# Patient Record
Sex: Female | Born: 1995 | Race: Black or African American | Hispanic: No | Marital: Single | Smoking: Never smoker
Health system: Southern US, Community
[De-identification: ages and names within clinical notes are randomized; demographics above are authoritative.]

## PROBLEM LIST (undated history)

## (undated) ENCOUNTER — Inpatient Hospital Stay (HOSPITAL_COMMUNITY): Payer: Self-pay

## (undated) DIAGNOSIS — D649 Anemia, unspecified: Secondary | ICD-10-CM

## (undated) DIAGNOSIS — M940 Chondrocostal junction syndrome [Tietze]: Secondary | ICD-10-CM

## (undated) HISTORY — PX: NO PAST SURGERIES: SHX2092

---
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
## 2018-01-22 ENCOUNTER — Encounter (HOSPITAL_COMMUNITY): Payer: Self-pay | Admitting: *Deleted

## 2018-01-22 ENCOUNTER — Other Ambulatory Visit: Payer: Self-pay

## 2018-01-22 ENCOUNTER — Ambulatory Visit (HOSPITAL_COMMUNITY)
Admission: EM | Admit: 2018-01-22 | Discharge: 2018-01-22 | Disposition: A | Discharge status: Home / Self Care | Payer: BLUE CROSS/BLUE SHIELD | Attending: Internal Medicine | Admitting: Internal Medicine

## 2018-01-22 DIAGNOSIS — H5711 Ocular pain, right eye: Secondary | ICD-10-CM

## 2018-01-22 MED ORDER — IBUPROFEN 800 MG PO TABS: 800.0000 mg | ORAL_TABLET | Freq: Three times a day (TID) | ORAL | 0 refills | Status: DC

## 2018-01-22 MED ORDER — TETRACAINE HCL 0.5 % OP SOLN
OPHTHALMIC | Status: AC
  Filled 2018-01-22: qty 4

## 2018-01-22 MED ORDER — FLUORESCEIN SODIUM 1 MG OP STRP
ORAL_STRIP | OPHTHALMIC | Status: AC
  Filled 2018-01-22: qty 1

## 2018-01-22 NOTE — ED Provider Notes (Signed)
[facility]    CSN: [id number] Arrival date & time: 01/22/18  1120     History   Chief Complaint Chief Complaint  Patient presents with  . Eye Problem    right eye swelling    HPI Cassandra Luna is a 22 y.o. female.   Cassandra Luna presents with complaints of right eye pain swelling and tearing which started 7/24. She states the swelling resolves by the end of the day, and returns and is worse in the morning when she wakes. No fevers. No injury. Denies any previous similar. No foreign body sensation and no known foreign body exposure potential. States vision feels normal, only difficulty is with swelling. Wears glasses, does not wear contacts. Follows with an eye doctor. Eye tenderness. No itching. Light sensitivity. Has not tried any treatments for her symptoms. Without contributing medical history.     ROS per HPI.      History reviewed. No pertinent past medical history.  There are no active problems to display for this patient.   History reviewed. No pertinent surgical history.  OB History   None      Home Medications    Prior to Admission medications   Medication Sig Start Date End Date Taking? Authorizing Provider  ibuprofen (ADVIL,MOTRIN) 800 MG tablet Take 1 tablet (800 mg total) by mouth 3 (three) times daily. 01/22/18   Georgetta HaberBurky, Odai Wimmer B, NP    Family History History reviewed. No pertinent family history.  Social History Social History   Tobacco Use  . Smoking status: Never Smoker  . Smokeless tobacco: Never Used  Substance Use Topics  . Alcohol use: No  . Drug use: Not on file     Allergies   Amoxicillin; Citrus; Peanut-containing drug products; Penicillins; Shellfish allergy; and Tomato   Review of Systems Review of Systems   Physical Exam Triage Vital Signs ED Triage Vitals  Enc Vitals Group     BP 01/22/18 1148 (!) 155/95     Pulse Rate 01/22/18 1148 76     Resp 01/22/18 1148 16     Temp 01/22/18 1148 98.1 F (36.7  C)     Temp Source 01/22/18 1148 Oral     SpO2 01/22/18 1148 100 %     Weight 01/22/18 1152 220 lb (99.8 kg)     Height 01/22/18 1152 5\' 1"  (1.549 m)     Head Circumference --      Peak Flow --      Pain Score 01/22/18 1151 0     Pain Loc --      Pain Edu? --      Excl. in GC? --    No data found.  Updated Vital Signs BP (!) 155/95 (BP Location: Left Arm)   Pulse 76   Temp 98.1 F (36.7 C) (Oral)   Resp 16   Ht 5\' 1"  (1.549 m)   Wt 220 lb (99.8 kg)   LMP  (LMP Unknown) Comment: Pt states LMP middle of May  SpO2 100%   BMI 41.57 kg/m   Visual Acuity Right Eye Distance: 20/100 Left Eye Distance: 20/25 Bilateral Distance: 20/30  Right Eye Near:   Left Eye Near:    Bilateral Near:  unable to open right eye at this time d/t swelling  Physical Exam  Constitutional: She is oriented to person, place, and time. She appears well-developed and well-nourished. No distress.  Eyes: Pupils are equal, round, and reactive to light. EOM are normal. Right eye exhibits discharge. Left  eye exhibits discharge. Right conjunctiva is injected.  Slit lamp exam:      The right eye shows no corneal abrasion, no foreign body, no hyphema and no fluorescein uptake.  Mild edema to upper and lower lid to right eye, no redness; mildly generalized tender; redness to right eye, no visible white; excess clear tearing, no purulent discharge; no pain s/p fluorescein and opens lids without difficulty at that time   Cardiovascular: Normal rate, regular rhythm and normal heart sounds.  Pulmonary/Chest: Effort normal and breath sounds normal.  Neurological: She is alert and oriented to person, place, and time.  Skin: Skin is warm and dry.     UC Treatments / Results  Labs (all labs ordered are listed, but only abnormal results are displayed) Labs Reviewed - No data to display  EKG None  Radiology No results found.  Procedures Procedures (including critical care time)  Medications Ordered in  UC Medications - No data to display  Initial Impression / Assessment and Plan / UC Course  I have reviewed the triage vital signs and the nursing notes.  Pertinent labs & imaging results that were available during my care of the patient were reviewed by me and considered in my medical decision making (see chart for details).     Redness, light senstivity, mild swelling and tenderness, no fluorescein uptake. Noted altered acuity, unknown baseline. Spoke to Dr. 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 Treat with oral nsaids, follow up for recheck at her office on Monday 7/29. Return precautions provided to patient and Dr. Blenda Bridegroom number provider per Dr's permission/recommendation. Patient verbalized understanding and agreeable to plan.   Final Clinical Impressions(s) / UC Diagnoses   Final diagnoses:  Acute pain in right eye     Discharge Instructions     Please take ibuprofen three times a day, take with food.  Cold compresses may be helpful with symptoms.  Please go on Monday to see Dr. Tawny Asal for follow up as I have spoken to her about you today.  If you have any worsening of symptoms, increased pain, vision change, fevers or worsening concerns you may go to the ER or you may call Dr. Tawny Asal- [phone]    ED Prescriptions    Medication Sig Dispense Auth. Provider   ibuprofen (ADVIL,MOTRIN) 800 MG tablet Take 1 tablet (800 mg total) by mouth 3 (three) times daily. 21 tablet Georgetta Haber, NP     Controlled Substance Prescriptions [place] Controlled Substance Registry consulted? Not Applicable   Georgetta Haber, NP 01/22/18 1252

## 2018-01-22 NOTE — ED Triage Notes (Signed)
Pt states right eye swelling started on Wednesday. Pt states that each morning her eye is "swollen shut" and that swelling decreased during the day. Pt denies any recent injury to eye.

## 2018-01-22 NOTE — Discharge Instructions (Signed)
Please take ibuprofen three times a day, take with food.  Cold compresses may be helpful with symptoms.  Please go on Monday to see Dr. Tawny AsalBottorff for follow up as I have spoken to her about you today.  If you have any worsening of symptoms, increased pain, vision change, fevers or worsening concerns you may go to the ER or you may call Dr. Tawny AsalBottorff- [phone]

## 2018-07-15 ENCOUNTER — Other Ambulatory Visit: Payer: Self-pay

## 2018-07-15 ENCOUNTER — Inpatient Hospital Stay (HOSPITAL_COMMUNITY)
Admission: AD | Admit: 2018-07-15 | Discharge: 2018-07-15 | Disposition: A | Discharge status: Home / Self Care | Payer: BLUE CROSS/BLUE SHIELD | Attending: Obstetrics & Gynecology | Admitting: Obstetrics & Gynecology

## 2018-07-15 ENCOUNTER — Encounter (HOSPITAL_COMMUNITY): Payer: Self-pay | Admitting: *Deleted

## 2018-07-15 DIAGNOSIS — Z3A14 14 weeks gestation of pregnancy: Secondary | ICD-10-CM

## 2018-07-15 DIAGNOSIS — O210 Mild hyperemesis gravidarum: Secondary | ICD-10-CM | POA: Diagnosis not present

## 2018-07-15 DIAGNOSIS — O26892 Other specified pregnancy related conditions, second trimester: Secondary | ICD-10-CM | POA: Diagnosis present

## 2018-07-15 DIAGNOSIS — O219 Vomiting of pregnancy, unspecified: Secondary | ICD-10-CM | POA: Diagnosis not present

## 2018-07-15 DIAGNOSIS — Z880 Allergy status to penicillin: Secondary | ICD-10-CM | POA: Insufficient documentation

## 2018-07-15 HISTORY — DX: Chondrocostal junction syndrome (tietze): M94.0

## 2018-07-15 LAB — URINALYSIS, ROUTINE W REFLEX MICROSCOPIC
BILIRUBIN URINE: NEGATIVE
GLUCOSE, UA: NEGATIVE mg/dL
HGB URINE DIPSTICK: NEGATIVE
Ketones, ur: NEGATIVE mg/dL
LEUKOCYTES UA: NEGATIVE
Nitrite: NEGATIVE
PH: 6 (ref 5.0–8.0)
Protein, ur: NEGATIVE mg/dL
Specific Gravity, Urine: 1.024 (ref 1.005–1.030)

## 2018-07-15 LAB — COMPREHENSIVE METABOLIC PANEL
ALT: 14 U/L (ref 0–44)
AST: 13 U/L — ABNORMAL LOW (ref 15–41)
Albumin: 3.1 g/dL — ABNORMAL LOW (ref 3.5–5.0)
Alkaline Phosphatase: 75 U/L (ref 38–126)
Anion gap: 8 (ref 5–15)
BUN: 5 mg/dL — AB (ref 6–20)
CO2: 20 mmol/L — ABNORMAL LOW (ref 22–32)
Calcium: 8.5 mg/dL — ABNORMAL LOW (ref 8.9–10.3)
Chloride: 105 mmol/L (ref 98–111)
Creatinine, Ser: 0.76 mg/dL (ref 0.44–1.00)
Glucose, Bld: 78 mg/dL (ref 70–99)
Potassium: 3.1 mmol/L — ABNORMAL LOW (ref 3.5–5.1)
Sodium: 133 mmol/L — ABNORMAL LOW (ref 135–145)
Total Bilirubin: 0.5 mg/dL (ref 0.3–1.2)
Total Protein: 7.1 g/dL (ref 6.5–8.1)

## 2018-07-15 LAB — CBC
HEMATOCRIT: 33.7 % — AB (ref 36.0–46.0)
Hemoglobin: 10.8 g/dL — ABNORMAL LOW (ref 12.0–15.0)
MCH: 24.9 pg — AB (ref 26.0–34.0)
MCHC: 32 g/dL (ref 30.0–36.0)
MCV: 77.8 fL — AB (ref 80.0–100.0)
NRBC: 0 % (ref 0.0–0.2)
Platelets: 280 10*3/uL (ref 150–400)
RBC: 4.33 MIL/uL (ref 3.87–5.11)
RDW: 14.8 % (ref 11.5–15.5)
WBC: 8.2 10*3/uL (ref 4.0–10.5)

## 2018-07-15 LAB — POCT PREGNANCY, URINE: Preg Test, Ur: POSITIVE — AB

## 2018-07-15 MED ORDER — ONDANSETRON 8 MG PO TBDP
8.0000 mg | ORAL_TABLET | Freq: Once | ORAL | Status: AC
  Administered 2018-07-15: 8 mg via ORAL
  Filled 2018-07-15: qty 1

## 2018-07-15 MED ORDER — METOCLOPRAMIDE HCL 10 MG PO TABS: 10.0000 mg | ORAL_TABLET | Freq: Three times a day (TID) | ORAL | 0 refills | Status: DC | PRN

## 2018-07-15 MED ORDER — ALUM & MAG HYDROXIDE-SIMETH 200-200-20 MG/5ML PO SUSP
30.0000 mL | Freq: Once | ORAL | Status: AC
  Administered 2018-07-15: 30 mL via ORAL
  Filled 2018-07-15: qty 30

## 2018-07-15 MED ORDER — LIDOCAINE VISCOUS HCL 2 % MT SOLN
15.0000 mL | Freq: Once | OROMUCOSAL | Status: AC
  Administered 2018-07-15: 15 mL via ORAL
  Filled 2018-07-15: qty 15

## 2018-07-15 MED ORDER — FAMOTIDINE 20 MG PO TABS: 20.0000 mg | ORAL_TABLET | Freq: Two times a day (BID) | ORAL | 0 refills | Status: DC

## 2018-07-15 NOTE — MAU Provider Note (Signed)
Chief Complaint: Nausea; Chest Pain; Dizziness; and Headache   First Provider Initiated Contact with Patient 07/15/18 1422     SUBJECTIVE HPI: Cassandra Luna is a 23 y.o. G1P0 at 4771w3d who presents to [facility] reporting nausea, dizziness, abdominal pain, and chest pain. Symptoms started after eating a [employer] hamburger last night. Reports substernal discomfort. No SOB. No hx of asthma. Generalized abdominal pain that is worse in upper area. Nauseated, but hasn't vomited. Feels like nausea is keeping her from eating since last night. Dizzy today, thinks that is dues to not eating or drinking.  Denies diarrhea, constipation, dysuria, or vaginal bleeding. Has not been seen for prenatal care.   Location: chest & abdomen Quality: aching and tightness Severity: 10/10 on pain scale Duration: 1 day Timing: constant Modifying factors: nothing makes better or worse Associated signs and symptoms: nausea  Past Medical History:  Diagnosis Date  . Costochondritis    OB History  Gravida Para Term Preterm AB Living  1            SAB TAB Ectopic Multiple Live Births               # Outcome Date GA Lbr Len/2nd Weight Sex Delivery Anes PTL Lv  1 Current            History reviewed. No pertinent surgical history. Social History   Socioeconomic History  . Marital status: Single    Spouse name: Not on file  . Number of children: Not on file  . Years of education: Not on file  . Highest education level: Not on file  Occupational History  . Not on file  Social Needs  . Financial resource strain: Not on file  . Food insecurity:    Worry: Not on file    Inability: Not on file  . Transportation needs:    Medical: Not on file    Non-medical: Not on file  Tobacco Use  . Smoking status: Never Smoker  . Smokeless tobacco: Never Used  Substance and Sexual Activity  . Alcohol use: No  . Drug use: Not Currently  . Sexual activity: Yes    Birth control/protection: None   Lifestyle  . Physical activity:    Days per week: Not on file    Minutes per session: Not on file  . Stress: Not on file  Relationships  . Social connections:    Talks on phone: Not on file    Gets together: Not on file    Attends religious service: Not on file    Active member of club or organization: Not on file    Attends meetings of clubs or organizations: Not on file    Relationship status: Not on file  . Intimate partner violence:    Fear of current or ex partner: Not on file    Emotionally abused: Not on file    Physically abused: Not on file    Forced sexual activity: Not on file  Other Topics Concern  . Not on file  Social History Narrative  . Not on file   History reviewed. No pertinent family history. No current facility-administered medications on file prior to encounter.    No current outpatient medications on file prior to encounter.   Allergies  Allergen Reactions  . Amoxicillin     "throat swelling"  . Citrus   . Peanut-Containing Drug Products   . Penicillins   . Shellfish Allergy   . Tomato     I have  reviewed patient's Past Medical Hx, Surgical Hx, Family Hx, Social Hx, medications and allergies.   Review of Systems  Constitutional: Negative.   Respiratory: Negative for shortness of breath.   Cardiovascular: Positive for chest pain. Negative for palpitations.  Gastrointestinal: Positive for abdominal pain and nausea. Negative for constipation, diarrhea and vomiting.  Genitourinary: Negative.   Neurological: Positive for dizziness and headaches.    OBJECTIVE Patient Vitals for the past 24 hrs:  BP Temp Temp src Pulse Resp SpO2 Weight  07/15/18 1736 119/64 - - - - - -  07/15/18 1311 125/71 98.6 F (37 C) Oral 63 16 100 % 97.2 kg  07/15/18 1309 - - - - - 100 % -   Constitutional: Well-developed, well-nourished female in no acute distress.  Cardiovascular: normal rate & rhythm, no murmur Respiratory: normal rate and effort. Lung sounds  clear throughout GI: Abd soft, non-tender, Pos BS x 4. No guarding or rebound tenderness MS: Extremities nontender, no edema, normal ROM Neurologic: Alert and oriented x 4.     LAB RESULTS Results for orders placed or performed during the hospital encounter of 07/15/18 (from the past 24 hour(s))  Urinalysis, Routine w reflex microscopic     Status: Abnormal   Collection Time: 07/15/18  1:22 PM  Result Value Ref Range   Color, Urine YELLOW YELLOW   APPearance HAZY (A) CLEAR   Specific Gravity, Urine 1.024 1.005 - 1.030   pH 6.0 5.0 - 8.0   Glucose, UA NEGATIVE NEGATIVE mg/dL   Hgb urine dipstick NEGATIVE NEGATIVE   Bilirubin Urine NEGATIVE NEGATIVE   Ketones, ur NEGATIVE NEGATIVE mg/dL   Protein, ur NEGATIVE NEGATIVE mg/dL   Nitrite NEGATIVE NEGATIVE   Leukocytes, UA NEGATIVE NEGATIVE  Pregnancy, urine POC     Status: Abnormal   Collection Time: 07/15/18  1:24 PM  Result Value Ref Range   Preg Test, Ur POSITIVE (A) NEGATIVE  CBC     Status: Abnormal   Collection Time: 07/15/18  3:36 PM  Result Value Ref Range   WBC 8.2 4.0 - 10.5 K/uL   RBC 4.33 3.87 - 5.11 MIL/uL   Hemoglobin 10.8 (L) 12.0 - 15.0 g/dL   HCT 78.233.7 (L) 95.636.0 - 21.346.0 %   MCV 77.8 (L) 80.0 - 100.0 fL   MCH 24.9 (L) 26.0 - 34.0 pg   MCHC 32.0 30.0 - 36.0 g/dL   RDW 08.614.8 57.811.5 - 46.915.5 %   Platelets 280 150 - 400 K/uL   nRBC 0.0 0.0 - 0.2 %  Comprehensive metabolic panel     Status: Abnormal   Collection Time: 07/15/18  3:36 PM  Result Value Ref Range   Sodium 133 (L) 135 - 145 mmol/L   Potassium 3.1 (L) 3.5 - 5.1 mmol/L   Chloride 105 98 - 111 mmol/L   CO2 20 (L) 22 - 32 mmol/L   Glucose, Bld 78 70 - 99 mg/dL   BUN 5 (L) 6 - 20 mg/dL   Creatinine, Ser 6.290.76 0.44 - 1.00 mg/dL   Calcium 8.5 (L) 8.9 - 10.3 mg/dL   Total Protein 7.1 6.5 - 8.1 g/dL   Albumin 3.1 (L) 3.5 - 5.0 g/dL   AST 13 (L) 15 - 41 U/L   ALT 14 0 - 44 U/L   Alkaline Phosphatase 75 38 - 126 U/L   Total Bilirubin 0.5 0.3 - 1.2 mg/dL   GFR calc  non Af Amer >60 >60 mL/min   GFR calc Af Amer >60 >60 mL/min  Anion gap 8 5 - 15    IMAGING No results found.  MAU COURSE Orders Placed This Encounter  Procedures  . Urinalysis, Routine w reflex microscopic  . CBC  . Comprehensive metabolic panel  . Pregnancy, urine POC  . ED EKG  . Discharge patient   Meds ordered this encounter  Medications  . ondansetron (ZOFRAN-ODT) disintegrating tablet 8 mg  . AND Linked Order Group   . alum & mag hydroxide-simeth (MAALOX/MYLANTA) 200-200-20 MG/5ML suspension 30 mL   . lidocaine (XYLOCAINE) 2 % viscous mouth solution 15 mL  . metoCLOPramide (REGLAN) 10 MG tablet    Sig: Take 1 tablet (10 mg total) by mouth every 8 (eight) hours as needed.    Dispense:  30 tablet    Refill:  0    Order Specific Question:   Supervising Provider    Answer:   Adam Phenix [[id number]]  . famotidine (PEPCID) 20 MG tablet    Sig: Take 1 tablet (20 mg total) by mouth 2 (two) times daily for 30 days.    Dispense:  60 tablet    Refill:  0    Order Specific Question:   Supervising Provider    Answer:   Adam Phenix [[id number]]    MDM FHT present by doppler VSS, NAD EKG, normal sinus rhythm Heart sounds normal & lung sounds clear throughout. SpO2 100% CBC & CMP ordered -- mild anemia  Zofran, Maalox & viscous lidocaine given Will send home w/rx for reglan & pepcid. Encouraged patient to start prenatal care.   ASSESSMENT 1. Nausea and vomiting during pregnancy prior to [redacted] weeks gestation   2. [redacted] weeks gestation of pregnancy     PLAN Discharge home in stable condition.   Allergies as of 07/15/2018      Reactions   Amoxicillin    "throat swelling"   Citrus    Peanut-containing Drug Products    Penicillins    Shellfish Allergy    Tomato       Medication List    STOP taking these medications   ibuprofen 800 MG tablet Commonly known as:  ADVIL,MOTRIN     TAKE these medications   famotidine 20 MG tablet Commonly known as:  PEPCID Take 1  tablet (20 mg total) by mouth 2 (two) times daily for 30 days.   metoCLOPramide 10 MG tablet Commonly known as:  REGLAN Take 1 tablet (10 mg total) by mouth every 8 (eight) hours as needed.        Judeth Horn, NP 07/15/2018  5:55 PM

## 2018-07-15 NOTE — Discharge Instructions (Signed)
[facility] ° ° °[facility]       Phone: [phone] ° °[facility] Phone: [phone] ° °[facility]  Phone: [phone] ° °[facility] [place] [facility]  Phone: [phone] ° °[facility]  Phone: [phone] ° °[facility]       Phone: [phone] ° °[facility]    Phone: [phone]  ° °[facility]    Phone: [phone] ° °[facility]    Phone: [phone] ° °[facility]    Phone: [phone]  ° °[facility]  Phone: [phone] ° °[facility]    Phone: [phone] ° °[facility]   Phone: [phone] ° °[facility]    Phone: [phone] ° ° ° ° ° ° ° °Second Trimester of Pregnancy °The second trimester is from week 14 through week 27 (months 4 through 6). The second trimester is often a time when you feel your best. Your body has adjusted to being pregnant, and you begin to feel better physically. Usually, morning sickness has lessened or quit completely, you may have more energy, and you may have an increase in appetite. The second trimester is also a time when the fetus is growing rapidly. At the end of the sixth month, the fetus is about 9 inches long and weighs about 1½ pounds. You will likely begin to feel the baby move (quickening) between 16 and 20 weeks of pregnancy. °Body changes during your second trimester °Your body continues to go through many changes during your second trimester. The changes vary from woman to woman. °· Your weight will continue to increase. You will notice your lower abdomen bulging out. °· You may begin to get stretch marks on your hips, abdomen, and breasts. °· You may develop headaches that  can be relieved by medicines. The medicines should be approved by your health care provider. °· You may urinate more often because the fetus is pressing on your bladder. °· You may develop or continue to have heartburn as a result of your pregnancy. °· You may develop constipation because certain hormones are causing the muscles that push waste through your intestines to slow down. °· You may develop hemorrhoids or swollen, bulging veins (varicose veins). °· You may have back pain. This is caused by: °? Weight gain. °? Pregnancy hormones that are relaxing the joints in your pelvis. °? A shift in weight and the muscles that support your balance. °· Your breasts will continue to grow and they will continue to become tender. °· Your gums may bleed and may be sensitive to brushing and flossing. °· Dark spots or blotches (chloasma, mask of pregnancy) may develop on your face. This will likely fade after the baby is born. °· A dark line from your belly button to the pubic area (linea nigra) may appear. This will likely fade after the baby is born. °· You may have changes in your hair. These can include thickening of your hair, rapid growth, and changes in texture. Some women also have hair loss during or after pregnancy, or hair that feels dry or thin. Your hair will most likely return to normal after your baby is born. °What to expect at prenatal visits °During a routine prenatal visit: °· You will be weighed to make sure you and the fetus are   growing normally. °· Your blood pressure will be taken. °· Your abdomen will be measured to track your baby's growth. °· The fetal heartbeat will be listened to. °· Any test results from the previous visit will be discussed. °Your health care provider may ask you: °· How you are feeling. °· If you are feeling the baby move. °· If you have had any abnormal symptoms, such as leaking fluid, bleeding, severe headaches, or abdominal cramping. °· If you are using any tobacco products,  including cigarettes, chewing tobacco, and electronic cigarettes. °· If you have any questions. °Other tests that may be performed during your second trimester include: °· Blood tests that check for: °? Low iron levels (anemia). °? High blood sugar that affects pregnant women (gestational diabetes) between 24 and 28 weeks. °? Rh antibodies. This is to check for a protein on red blood cells (Rh factor). °· Urine tests to check for infections, diabetes, or protein in the urine. °· An ultrasound to confirm the proper growth and development of the baby. °· An amniocentesis to check for possible genetic problems. °· Fetal screens for spina bifida and Down syndrome. °· HIV (human immunodeficiency virus) testing. Routine prenatal testing includes screening for HIV, unless you choose not to have this test. °Follow these instructions at home: °Medicines °· Follow your health care provider's instructions regarding medicine use. Specific medicines may be either safe or unsafe to take during pregnancy. °· Take a prenatal vitamin that contains at least 600 micrograms (mcg) of folic acid. °· If you develop constipation, try taking a stool softener if your health care provider approves. °Eating and drinking ° °· Eat a balanced diet that includes fresh fruits and vegetables, whole grains, good sources of protein such as meat, eggs, or tofu, and low-fat dairy. Your health care provider will help you determine the amount of weight gain that is right for you. °· Avoid raw meat and uncooked cheese. These carry germs that can cause birth defects in the baby. °· If you have low calcium intake from food, talk to your health care provider about whether you should take a daily calcium supplement. °· Limit foods that are high in fat and processed sugars, such as fried and sweet foods. °· To prevent constipation: °? Drink enough fluid to keep your urine clear or pale yellow. °? Eat foods that are high in fiber, such as fresh fruits and  vegetables, whole grains, and beans. °Activity °· Exercise only as directed by your health care provider. Most women can continue their usual exercise routine during pregnancy. Try to exercise for 30 minutes at least 5 days a week. Stop exercising if you experience uterine contractions. °· Avoid heavy lifting, wear low heel shoes, and practice good posture. °· A sexual relationship may be continued unless your health care provider directs you otherwise. °Relieving pain and discomfort °· Wear a good support bra to prevent discomfort from breast tenderness. °· Take warm sitz baths to soothe any pain or discomfort caused by hemorrhoids. Use hemorrhoid cream if your health care provider approves. °· Rest with your legs elevated if you have leg cramps or low back pain. °· If you develop varicose veins, wear support hose. Elevate your feet for 15 minutes, 3-4 times a day. Limit salt in your diet. °Prenatal Care °· Write down your questions. Take them to your prenatal visits. °· Keep all your prenatal visits as told by your health care provider. This is important. °Safety °· Wear your seat belt at all   times when driving. °· Make a list of emergency phone numbers, including numbers for family, friends, the hospital, and police and fire departments. °General instructions °· Ask your health care provider for a referral to a local prenatal education class. Begin classes no later than the beginning of month 6 of your pregnancy. °· Ask for help if you have counseling or nutritional needs during pregnancy. Your health care provider can offer advice or refer you to specialists for help with various needs. °· Do not use hot tubs, steam rooms, or saunas. °· Do not douche or use tampons or scented sanitary pads. °· Do not cross your legs for long periods of time. °· Avoid cat litter boxes and soil used by cats. These carry germs that can cause birth defects in the baby and possibly loss of the fetus by miscarriage or  stillbirth. °· Avoid all smoking, herbs, alcohol, and unprescribed drugs. Chemicals in these products can affect the formation and growth of the baby. °· Do not use any products that contain nicotine or tobacco, such as cigarettes and e-cigarettes. If you need help quitting, ask your health care provider. °· Visit your dentist if you have not gone yet during your pregnancy. Use a soft toothbrush to brush your teeth and be gentle when you floss. °Contact a health care provider if: °· You have dizziness. °· You have mild pelvic cramps, pelvic pressure, or nagging pain in the abdominal area. °· You have persistent nausea, vomiting, or diarrhea. °· You have a bad smelling vaginal discharge. °· You have pain when you urinate. °Get help right away if: °· You have a fever. °· You are leaking fluid from your vagina. °· You have spotting or bleeding from your vagina. °· You have severe abdominal cramping or pain. °· You have rapid weight gain or weight loss. °· You have shortness of breath with chest pain. °· You notice sudden or extreme swelling of your face, hands, ankles, feet, or legs. °· You have not felt your baby move in over an hour. °· You have severe headaches that do not go away when you take medicine. °· You have vision changes. °Summary °· The second trimester is from week 14 through week 27 (months 4 through 6). It is also a time when the fetus is growing rapidly. °· Your body goes through many changes during pregnancy. The changes vary from woman to woman. °· Avoid all smoking, herbs, alcohol, and unprescribed drugs. These chemicals affect the formation and growth your baby. °· Do not use any tobacco products, such as cigarettes, chewing tobacco, and e-cigarettes. If you need help quitting, ask your health care provider. °· Contact your health care provider if you have any questions. Keep all prenatal visits as told by your health care provider. This is important. °This information is not intended to replace  advice given to you by your health care provider. Make sure you discuss any questions you have with your health care provider. °Document Released: 06/09/2001 Document Revised: 07/21/2016 Document Reviewed: 07/21/2016 °Elsevier Interactive Patient Education © 2019 [employer] ° °

## 2018-07-15 NOTE — MAU Note (Signed)
Last night had a hamburger from [employer].  This morning woke up with a headache.  Has felt dizzy, nauseated and had cramping in her stomach.  Felt tight in her chest, feels like couldn't catch her breath.  (pt talking without pausing, no apparent distress, closed mouth breathing). Has not been seen with preg yet, does not have appt. +HPT at [facility] and again 2 wks later.Marland Kitchen

## 2018-08-03 ENCOUNTER — Encounter (HOSPITAL_COMMUNITY): Payer: Self-pay | Admitting: Emergency Medicine

## 2018-08-03 ENCOUNTER — Ambulatory Visit (HOSPITAL_COMMUNITY)
Admission: EM | Admit: 2018-08-03 | Discharge: 2018-08-03 | Disposition: A | Discharge status: Home / Self Care | Payer: BLUE CROSS/BLUE SHIELD | Attending: Emergency Medicine | Admitting: Emergency Medicine

## 2018-08-03 DIAGNOSIS — B349 Viral infection, unspecified: Secondary | ICD-10-CM | POA: Diagnosis not present

## 2018-08-03 MED ORDER — FLUTICASONE PROPIONATE 50 MCG/ACT NA SUSP
2.0000 | Freq: Every day | NASAL | 0 refills | Status: DC
Start: 2018-08-03 — End: 2018-08-20

## 2018-08-03 NOTE — ED Triage Notes (Signed)
Pt presents to [facility] at [redacted] weeks pregnant with cough causing central chest pain, congestion, headaches.  Denies n/v/d or abdominal pain.

## 2018-08-03 NOTE — Discharge Instructions (Addendum)
Start flonase for nasal congestion/drainage. You can use over the counter nasal saline rinse such as neti pot for nasal congestion. Keep hydrated, your urine should be clear to pale yellow in color. Tylenol for fever and pain. Monitor for any worsening of symptoms, chest pain, shortness of breath, wheezing, swelling of the throat, follow up for reevaluation.

## 2018-08-03 NOTE — ED Provider Notes (Signed)
[facility]    CSN: [id number] Arrival date & time: 08/03/18  1952     History   Chief Complaint Chief Complaint  Patient presents with  . Flu-Like Symptoms  . Routine Prenatal Visit    HPI Cassandra Luna is a 23 y.o. female.   89 tear old female who is [redacted] weeks pregnant comes in for few day history of URI symptoms. Has had cough, rhinorrhea, nasal congestion, subjective fever. Has had chest soreness with cough. Denies consistent chest pain/soreness. Denies shortness of breath, wheezing. Denies abdominal pain, nausea, vomiting, diarrhea. Denies vaginal bleeding. Has not tried anything for the symptoms.      Past Medical History:  Diagnosis Date  . Costochondritis     There are no active problems to display for this patient.   History reviewed. No pertinent surgical history.  OB History    Gravida  1   Para      Term      Preterm      AB      Living        SAB      TAB      Ectopic      Multiple      Live Births               Home Medications    Prior to Admission medications   Medication Sig Start Date End Date Taking? Authorizing Provider  fluticasone (FLONASE) 50 MCG/ACT nasal spray Place 2 sprays into both nostrils daily. 08/03/18   Belinda Fisher, PA-C    Family History History reviewed. No pertinent family history.  Social History Social History   Tobacco Use  . Smoking status: Never Smoker  . Smokeless tobacco: Never Used  Substance Use Topics  . Alcohol use: No  . Drug use: Not Currently     Allergies   Amoxicillin; Citrus; Peanut-containing drug products; Penicillins; Shellfish allergy; and Tomato   Review of Systems Review of Systems  Reason unable to perform ROS: See HPI as above.     Physical Exam Triage Vital Signs ED Triage Vitals  Enc Vitals Group     BP 08/03/18 2014 120/80     Pulse Rate 08/03/18 2014 72     Resp 08/03/18 2014 20     Temp 08/03/18 2014 97.8 F (36.6 C)     Temp Source  08/03/18 2014 Temporal     SpO2 08/03/18 2014 100 %     Weight --      Height --      Head Circumference --      Peak Flow --      Pain Score 08/03/18 2015 7     Pain Loc --      Pain Edu? --      Excl. in GC? --    No data found.  Updated Vital Signs BP 120/80 (BP Location: Right Arm)   Pulse 72   Temp 97.8 F (36.6 C) (Temporal)   Resp 20   LMP 04/05/2018   SpO2 100%   Physical Exam Constitutional:      General: She is not in acute distress.    Appearance: She is well-developed. She is not ill-appearing, toxic-appearing or diaphoretic.  HENT:     Head: Normocephalic and atraumatic.     Right Ear: Tympanic membrane, ear canal and external ear normal. Tympanic membrane is not erythematous or bulging.     Left Ear: Tympanic membrane, ear canal and external ear  normal. Tympanic membrane is not erythematous or bulging.     Nose:     Right Sinus: Frontal sinus tenderness present. No maxillary sinus tenderness.     Left Sinus: Frontal sinus tenderness present. No maxillary sinus tenderness.     Mouth/Throat:     Pharynx: Uvula midline.  Eyes:     Conjunctiva/sclera: Conjunctivae normal.     Pupils: Pupils are equal, round, and reactive to light.  Neck:     Musculoskeletal: Normal range of motion and neck supple.  Cardiovascular:     Rate and Rhythm: Normal rate and regular rhythm.     Heart sounds: Normal heart sounds. No murmur. No friction rub. No gallop.   Pulmonary:     Effort: Pulmonary effort is normal.     Breath sounds: Normal breath sounds. No decreased breath sounds, wheezing, rhonchi or rales.  Abdominal:     General: Bowel sounds are normal.     Tenderness: There is no abdominal tenderness. There is no guarding or rebound.  Lymphadenopathy:     Cervical: No cervical adenopathy.  Skin:    General: Skin is warm and dry.  Neurological:     Mental Status: She is alert and oriented to person, place, and time.  Psychiatric:        Behavior: Behavior normal.         Judgment: Judgment normal.      UC Treatments / Results  Labs (all labs ordered are listed, but only abnormal results are displayed) Labs Reviewed - No data to display  EKG None  Radiology No results found.  Procedures Procedures (including critical care time)  Medications Ordered in UC Medications - No data to display  Initial Impression / Assessment and Plan / UC Course  I have reviewed the triage vital signs and the nursing notes.  Pertinent labs & imaging results that were available during my care of the patient were reviewed by me and considered in my medical decision making (see chart for details).    Discussed with patient history and exam most consistent with viral URI. Symptomatic treatment as needed. Push fluids. Return precautions given.   Final Clinical Impressions(s) / UC Diagnoses   Final diagnoses:  Viral illness   ED Prescriptions    Medication Sig Dispense Auth. Provider   fluticasone (FLONASE) 50 MCG/ACT nasal spray Place 2 sprays into both nostrils daily. 1 g Edwinna AreolaYu, Eugine Bubb V, PA-C        Rashid Whitenight V, PA-C 08/04/18 0003

## 2018-08-13 ENCOUNTER — Other Ambulatory Visit: Payer: Self-pay

## 2018-08-13 ENCOUNTER — Emergency Department (HOSPITAL_COMMUNITY)
Admission: EM | Admit: 2018-08-13 | Discharge: 2018-08-14 | Disposition: A | Discharge status: Home / Self Care | Payer: BLUE CROSS/BLUE SHIELD | Attending: Emergency Medicine | Admitting: Emergency Medicine

## 2018-08-13 ENCOUNTER — Encounter (HOSPITAL_COMMUNITY): Payer: Self-pay | Admitting: Emergency Medicine

## 2018-08-13 DIAGNOSIS — Z91010 Allergy to peanuts: Secondary | ICD-10-CM | POA: Insufficient documentation

## 2018-08-13 DIAGNOSIS — K920 Hematemesis: Secondary | ICD-10-CM | POA: Insufficient documentation

## 2018-08-13 NOTE — ED Triage Notes (Signed)
Pt c/o vomiting bright red blood x 30 min.  States she is approx [redacted] weeks pregnant and has lower abd cramping x 2 days.  States she has not had prenatal care because she is waiting for [facility] to move to this campus.

## 2018-08-14 LAB — COMPREHENSIVE METABOLIC PANEL
ALK PHOS: 84 U/L (ref 38–126)
ALT: 37 U/L (ref 0–44)
AST: 26 U/L (ref 15–41)
Albumin: 3 g/dL — ABNORMAL LOW (ref 3.5–5.0)
Anion gap: 8 (ref 5–15)
BUN: 5 mg/dL — ABNORMAL LOW (ref 6–20)
CO2: 19 mmol/L — ABNORMAL LOW (ref 22–32)
Calcium: 8.8 mg/dL — ABNORMAL LOW (ref 8.9–10.3)
Chloride: 104 mmol/L (ref 98–111)
Creatinine, Ser: 0.94 mg/dL (ref 0.44–1.00)
GFR calc Af Amer: >60 mL/min (ref >60)
Glucose, Bld: 80 mg/dL (ref 70–99)
Potassium: 3.1 mmol/L — ABNORMAL LOW (ref 3.5–5.1)
Sodium: 131 mmol/L — ABNORMAL LOW (ref 135–145)
Total Bilirubin: 0.6 mg/dL (ref 0.3–1.2)
Total Protein: 6.7 g/dL (ref 6.5–8.1)

## 2018-08-14 LAB — CBC
HCT: 32.1 % — ABNORMAL LOW (ref 36.0–46.0)
HCT: 33.7 % — ABNORMAL LOW (ref 36.0–46.0)
Hemoglobin: 10.5 g/dL — ABNORMAL LOW (ref 12.0–15.0)
Hemoglobin: 11 g/dL — ABNORMAL LOW (ref 12.0–15.0)
MCH: 25.8 pg — ABNORMAL LOW (ref 26.0–34.0)
MCH: 25.9 pg — AB (ref 26.0–34.0)
MCHC: 32.6 g/dL (ref 30.0–36.0)
MCHC: 32.7 g/dL (ref 30.0–36.0)
MCV: 79.1 fL — AB (ref 80.0–100.0)
MCV: 79.1 fL — ABNORMAL LOW (ref 80.0–100.0)
Platelets: 279 10*3/uL (ref 150–400)
Platelets: 289 10*3/uL (ref 150–400)
RBC: 4.06 MIL/uL (ref 3.87–5.11)
RBC: 4.26 MIL/uL (ref 3.87–5.11)
RDW: 15 % (ref 11.5–15.5)
RDW: 15.2 % (ref 11.5–15.5)
WBC: 8.5 10*3/uL (ref 4.0–10.5)
WBC: 9.7 10*3/uL (ref 4.0–10.5)
nRBC: 0 % (ref 0.0–0.2)
nRBC: 0 % (ref 0.0–0.2)

## 2018-08-14 LAB — TYPE AND SCREEN
ABO/RH(D): B NEG
ANTIBODY SCREEN: NEGATIVE

## 2018-08-14 LAB — ABO/RH: ABO/RH(D): B NEG

## 2018-08-14 MED ORDER — RANITIDINE HCL 150 MG PO CAPS: 150.0000 mg | ORAL_CAPSULE | Freq: Every day | ORAL | 0 refills | Status: DC

## 2018-08-14 NOTE — ED Provider Notes (Signed)
[facility] EMERGENCY DEPARTMENT Provider Note   CSN: [id number] Arrival date & time: 08/13/18  2321     History   Chief Complaint Chief Complaint  Patient presents with  . Hematemesis    HPI Cassandra Luna is a 23 y.o. female.  HPI 23 year old female comes in with chief complaint of bloody vomiting. Patient is G1, P0 and is in second trimester. She states that earlier today she started having nausea followed by emesis.  Her emesis initially had blue slurred P, followed by bright red blood.  She only had one episode of emesis.  She denies any epigastric abdominal pain, or bloody stools.  Patient does not take aspirin, Plavix nor does she smoke or have history of heavy drinking.   Past Medical History:  Diagnosis Date  . Costochondritis     There are no active problems to display for this patient.   History reviewed. No pertinent surgical history.   OB History    Gravida  1   Para      Term      Preterm      AB      Living        SAB      TAB      Ectopic      Multiple      Live Births               Home Medications    Prior to Admission medications   Medication Sig Start Date End Date Taking? Authorizing Provider  fluticasone (FLONASE) 50 MCG/ACT nasal spray Place 2 sprays into both nostrils daily. 08/03/18   Belinda Fisher, PA-C    Family History No family history on file.  Social History Social History   Tobacco Use  . Smoking status: Never Smoker  . Smokeless tobacco: Never Used  Substance Use Topics  . Alcohol use: No  . Drug use: Not Currently     Allergies   Amoxicillin; Citrus; Peanut-containing drug products; Penicillins; Shellfish allergy; and Tomato   Review of Systems Review of Systems  Constitutional: Negative for activity change.  Gastrointestinal: Positive for nausea and vomiting. Negative for abdominal pain.  Genitourinary: Negative for vaginal bleeding.  Neurological: Negative for dizziness.    Hematological: Does not bruise/bleed easily.  All other systems reviewed and are negative.    Physical Exam Updated Vital Signs BP 120/73 (BP Location: Right Arm)   Pulse 86   Temp 98.2 F (36.8 C) (Oral)   Resp 16   LMP 04/05/2018   SpO2 99%   Physical Exam Vitals signs and nursing note reviewed.  Constitutional:      Appearance: She is well-developed.  HENT:     Head: Normocephalic and atraumatic.  Neck:     Musculoskeletal: Normal range of motion and neck supple.  Cardiovascular:     Rate and Rhythm: Normal rate.  Pulmonary:     Effort: Pulmonary effort is normal.  Abdominal:     General: Bowel sounds are normal.  Skin:    General: Skin is warm and dry.  Neurological:     Mental Status: She is alert and oriented to person, place, and time.      ED Treatments / Results  Labs (all labs ordered are listed, but only abnormal results are displayed) Labs Reviewed  COMPREHENSIVE METABOLIC PANEL - Abnormal; Notable for the following components:      Result Value   Sodium 131 (*)    Potassium 3.1 (*)  CO2 19 (*)    BUN 5 (*)    Calcium 8.8 (*)    Albumin 3.0 (*)    All other components within normal limits  CBC - Abnormal; Notable for the following components:   Hemoglobin 11.0 (*)    HCT 33.7 (*)    MCV 79.1 (*)    MCH 25.8 (*)    All other components within normal limits  CBC - Abnormal; Notable for the following components:   Hemoglobin 10.5 (*)    HCT 32.1 (*)    MCV 79.1 (*)    MCH 25.9 (*)    All other components within normal limits  TYPE AND SCREEN  ABO/RH    EKG None  Radiology No results found.  Procedures Procedures (including critical care time)  Medications Ordered in ED Medications - No data to display   Initial Impression / Assessment and Plan / ED Course  I have reviewed the triage vital signs and the nursing notes.  Pertinent labs & imaging results that were available during my care of the patient were reviewed by me and  considered in my medical decision making (see chart for details).  Clinical Course as of Aug 14 253  Sun Aug 14, 2018  0255 Results from the ER workup discussed with the patient face to face and all questions answered to the best of my ability.  Repeat hemoglobin is not significantly low.  BUN was already normal.  We will discharge.  Hemoglobin(!): 10.5 [AN]    Clinical Course User Index [AN] Derwood Kaplan, MD    23 year old G1P0 woman comes to the ER with chief complaint of bloody emesis x1.  She does not have any abdominal discomfort nor does she have any pelvic discomfort or vaginal bleeding.  On my evaluation patient is comfortable.  Her hemoglobin is appearing normal -we will get a repeat CBC to ensure there is no significant drop.  Patient does not have any high risk factors for massive upper GI bleed.  If the repeat CBC is normal and patient is not having any further bloody emesis we will discharge.  Final Clinical Impressions(s) / ED Diagnoses   Final diagnoses:  Hematemesis, presence of nausea not specified    ED Discharge Orders    None       Derwood Kaplan, MD 08/14/18 [phone]

## 2018-08-14 NOTE — Discharge Instructions (Addendum)
We saw in the ER for bloody vomitus.  Your hemoglobin has stayed normal.  You have not had any other episodes of bloody vomitus.  We are unsure as to what caused her to bleed.  Please take the medication prescribed.  Please follow-up with your OB doctor in 1 week to see if you need to be on Zantac for long time.  Return to the ER if your symptoms progress.

## 2018-08-14 NOTE — ED Notes (Signed)
Patient given [place] sandwich/apple sauce and juice.

## 2018-08-19 ENCOUNTER — Other Ambulatory Visit: Payer: Self-pay

## 2018-08-19 ENCOUNTER — Emergency Department (HOSPITAL_COMMUNITY)
Admission: EM | Admit: 2018-08-19 | Discharge: 2018-08-20 | Disposition: A | Discharge status: Home / Self Care | Payer: BLUE CROSS/BLUE SHIELD | Attending: Emergency Medicine | Admitting: Emergency Medicine

## 2018-08-19 ENCOUNTER — Encounter (HOSPITAL_COMMUNITY): Payer: Self-pay | Admitting: *Deleted

## 2018-08-19 DIAGNOSIS — R109 Unspecified abdominal pain: Secondary | ICD-10-CM | POA: Diagnosis not present

## 2018-08-19 DIAGNOSIS — Z91010 Allergy to peanuts: Secondary | ICD-10-CM | POA: Insufficient documentation

## 2018-08-19 DIAGNOSIS — Z3A20 20 weeks gestation of pregnancy: Secondary | ICD-10-CM | POA: Insufficient documentation

## 2018-08-19 DIAGNOSIS — O26892 Other specified pregnancy related conditions, second trimester: Secondary | ICD-10-CM | POA: Diagnosis not present

## 2018-08-19 NOTE — ED Triage Notes (Signed)
Pt c/o abd cramping for the past couple of days, more frequent now; reports dark brown discharge. Pt does not have an OB; FHT 156.

## 2018-08-20 LAB — URINALYSIS, ROUTINE W REFLEX MICROSCOPIC
Bilirubin Urine: NEGATIVE
Glucose, UA: NEGATIVE mg/dL
Hgb urine dipstick: NEGATIVE
Ketones, ur: NEGATIVE mg/dL
Leukocytes,Ua: NEGATIVE
Nitrite: NEGATIVE
Protein, ur: NEGATIVE mg/dL
Specific Gravity, Urine: 1.017 (ref 1.005–1.030)
pH: 6 (ref 5.0–8.0)

## 2018-08-20 LAB — CBC WITH DIFFERENTIAL/PLATELET
Abs Immature Granulocytes: 0.11 10*3/uL — ABNORMAL HIGH (ref 0.00–0.07)
BASOS ABS: 0 10*3/uL (ref 0.0–0.1)
Basophils Relative: 0 %
Eosinophils Absolute: 0.2 10*3/uL (ref 0.0–0.5)
Eosinophils Relative: 3 %
HCT: 35.2 % — ABNORMAL LOW (ref 36.0–46.0)
Hemoglobin: 11.1 g/dL — ABNORMAL LOW (ref 12.0–15.0)
IMMATURE GRANULOCYTES: 1 %
Lymphocytes Relative: 21 %
Lymphs Abs: 2 10*3/uL (ref 0.7–4.0)
MCH: 25.3 pg — AB (ref 26.0–34.0)
MCHC: 31.5 g/dL (ref 30.0–36.0)
MCV: 80.4 fL (ref 80.0–100.0)
Monocytes Absolute: 0.7 10*3/uL (ref 0.1–1.0)
Monocytes Relative: 7 %
NEUTROS PCT: 68 %
Neutro Abs: 6.3 10*3/uL (ref 1.7–7.7)
Platelets: 290 10*3/uL (ref 150–400)
RBC: 4.38 MIL/uL (ref 3.87–5.11)
RDW: 15.1 % (ref 11.5–15.5)
WBC: 9.3 10*3/uL (ref 4.0–10.5)
nRBC: 0 % (ref 0.0–0.2)

## 2018-08-20 LAB — BASIC METABOLIC PANEL
Anion gap: 7 (ref 5–15)
BUN: <5 mg/dL — ABNORMAL LOW (ref 6–20)
CO2: 21 mmol/L — ABNORMAL LOW (ref 22–32)
Calcium: 9 mg/dL (ref 8.9–10.3)
Chloride: 108 mmol/L (ref 98–111)
Creatinine, Ser: 0.8 mg/dL (ref 0.44–1.00)
GFR calc Af Amer: >60 mL/min (ref >60)
GFR calc non Af Amer: >60 mL/min (ref >60)
Glucose, Bld: 93 mg/dL (ref 70–99)
POTASSIUM: 3.6 mmol/L (ref 3.5–5.1)
Sodium: 136 mmol/L (ref 135–145)

## 2018-08-20 MED ORDER — SODIUM CHLORIDE 0.9 % IV BOLUS
1000.0000 mL | Freq: Once | INTRAVENOUS | Status: AC
  Administered 2018-08-20: 1000 mL via INTRAVENOUS

## 2018-08-20 MED ORDER — ACETAMINOPHEN 500 MG PO TABS
1000.0000 mg | ORAL_TABLET | Freq: Once | ORAL | Status: AC
  Administered 2018-08-20: 1000 mg via ORAL
  Filled 2018-08-20: qty 2

## 2018-08-20 NOTE — ED Provider Notes (Signed)
[facility] EMERGENCY DEPARTMENT Provider Note   CSN: [id number] Arrival date & time: 08/19/18  2151    History   Chief Complaint Chief Complaint  Patient presents with  . Abdominal Cramping    HPI Cassandra Luna is a 23 y.o. female.      Abdominal Cramping  This is a new problem. The problem occurs constantly. The problem has not changed since onset.Associated symptoms include abdominal pain. Pertinent negatives include no chest pain, no headaches and no shortness of breath. Nothing aggravates the symptoms. Nothing relieves the symptoms. She has tried nothing for the symptoms.    Past Medical History:  Diagnosis Date  . Costochondritis     There are no active problems to display for this patient.   History reviewed. No pertinent surgical history.   OB History    Gravida  1   Para      Term      Preterm      AB      Living        SAB      TAB      Ectopic      Multiple      Live Births               Home Medications    Prior to Admission medications   Medication Sig Start Date End Date Taking? Authorizing Provider  ranitidine (ZANTAC) 150 MG capsule Take 1 capsule (150 mg total) by mouth daily. 08/14/18  Yes Derwood Kaplan, MD    Family History No family history on file.  Social History Social History   Tobacco Use  . Smoking status: Never Smoker  . Smokeless tobacco: Never Used  Substance Use Topics  . Alcohol use: No  . Drug use: Not Currently     Allergies   Amoxicillin; Citrus; Peanut-containing drug products; Penicillins; Shellfish allergy; and Tomato   Review of Systems Review of Systems  Respiratory: Negative for shortness of breath.   Cardiovascular: Negative for chest pain.  Gastrointestinal: Positive for abdominal pain.  Neurological: Negative for headaches.  All other systems reviewed and are negative.    Physical Exam Updated Vital Signs BP 124/68 (BP Location: Right Arm)   Pulse  72   Temp 98.7 F (37.1 C) (Oral)   Resp 18   LMP 04/05/2018   SpO2 99%   Physical Exam Vitals signs and nursing note reviewed.  Constitutional:      Appearance: She is well-developed.  HENT:     Head: Normocephalic and atraumatic.     Nose: Nose normal.  Eyes:     Extraocular Movements: Extraocular movements intact.     Conjunctiva/sclera: Conjunctivae normal.  Neck:     Musculoskeletal: Normal range of motion.  Cardiovascular:     Rate and Rhythm: Normal rate and regular rhythm.  Pulmonary:     Effort: Pulmonary effort is normal. No respiratory distress.     Breath sounds: Normal breath sounds. No stridor.  Abdominal:     General: Abdomen is flat. There is no distension.  Musculoskeletal: Normal range of motion.        General: No swelling or tenderness.  Skin:    General: Skin is warm and dry.  Neurological:     General: No focal deficit present.     Mental Status: She is alert.     Cranial Nerves: No cranial nerve deficit.      ED Treatments / Results  Labs (all labs ordered are  listed, but only abnormal results are displayed) Labs Reviewed  CBC WITH DIFFERENTIAL/PLATELET - Abnormal; Notable for the following components:      Result Value   Hemoglobin 11.1 (*)    HCT 35.2 (*)    MCH 25.3 (*)    Abs Immature Granulocytes 0.11 (*)    All other components within normal limits  BASIC METABOLIC PANEL - Abnormal; Notable for the following components:   CO2 21 (*)    BUN <5 (*)    All other components within normal limits  URINE CULTURE  URINALYSIS, ROUTINE W REFLEX MICROSCOPIC    EKG None  Radiology No results found.  Procedures Procedures (including critical care time)  EMERGENCY DEPARTMENT [place] PREGNANCY "Study: Limited Ultrasound of the Pelvis for Pregnancy"  INDICATIONS:Pregnancy(required) and Abdominal or pelvic pain Multiple views of the uterus and pelvic cavity were obtained in real-time with a multi-frequency probe.  APPROACH:Transabdominal     PERFORMED BY: Myself  IMAGES ARCHIVED?: Yes  LIMITATIONS: Body habitus  PREGNANCY FREE FLUID: None  ADNEXAL FINDINGS:Left ovary not seen and Right ovary not seen  PREGNANCY FINDINGS: Fetal heart activity seen  INTERPRETATION: Fetal heart activity seen  GESTATIONAL AGE, ESTIMATE: 22  FETAL HEART RATE: 136  CPT Codes:  00174-94 (transabdominal OB)  768-26-52 (transvaginal OB, Reduced level of service for incomplete exam)    Medications Ordered in ED Medications  sodium chloride 0.9 % bolus 1,000 mL (0 mLs Intravenous Stopped 08/20/18 0514)  acetaminophen (TYLENOL) tablet 1,000 mg (1,000 mg Oral Given 08/20/18 0412)     Initial Impression / Assessment and Plan / ED Course  I have reviewed the triage vital signs and the nursing notes.  Pertinent labs & imaging results that were available during my care of the patient were reviewed by me and considered in my medical decision making (see chart for details).        Abdominal cramping.  No evidence of UTI.  Has intrauterine pregnancy with appropriate heart rate.  Has not had any prenatal care.  Refuses to follow-up until women's moves to [facility].  Encouraged that she follows up sooner.  Otherwise to take Tylenol and other supportive measures in the meantime.  Final Clinical Impressions(s) / ED Diagnoses   Final diagnoses:  [redacted] weeks gestation of pregnancy    ED Discharge Orders    None       Sherica Paternostro, Barbara Cower, MD 08/20/18 [phone]

## 2018-08-20 NOTE — ED Notes (Signed)
Pt remains in waiting room. Updated on wait for treatment room. 

## 2018-08-21 LAB — URINE CULTURE

## 2018-08-31 ENCOUNTER — Ambulatory Visit: Payer: BLUE CROSS/BLUE SHIELD

## 2018-09-04 ENCOUNTER — Other Ambulatory Visit: Payer: Self-pay

## 2018-09-04 ENCOUNTER — Emergency Department (HOSPITAL_COMMUNITY)
Admission: EM | Admit: 2018-09-04 | Discharge: 2018-09-04 | Disposition: A | Discharge status: Home / Self Care | Payer: BLUE CROSS/BLUE SHIELD | Attending: Emergency Medicine | Admitting: Emergency Medicine

## 2018-09-04 ENCOUNTER — Encounter (HOSPITAL_COMMUNITY): Payer: Self-pay

## 2018-09-04 DIAGNOSIS — Z5321 Procedure and treatment not carried out due to patient leaving prior to being seen by health care provider: Secondary | ICD-10-CM | POA: Diagnosis not present

## 2018-09-04 DIAGNOSIS — K0889 Other specified disorders of teeth and supporting structures: Secondary | ICD-10-CM | POA: Insufficient documentation

## 2018-09-04 NOTE — ED Notes (Signed)
Called for room, no answer X2 

## 2018-09-04 NOTE — ED Triage Notes (Signed)
Pt states that for the past two days she has been having R lower tooth pain unrelieved by OTC meds

## 2018-09-12 ENCOUNTER — Other Ambulatory Visit: Payer: Self-pay

## 2018-09-12 ENCOUNTER — Encounter: Payer: Self-pay | Admitting: Advanced Practice Midwife

## 2018-09-12 ENCOUNTER — Ambulatory Visit (INDEPENDENT_AMBULATORY_CARE_PROVIDER_SITE_OTHER): Payer: BLUE CROSS/BLUE SHIELD | Admitting: Advanced Practice Midwife

## 2018-09-12 ENCOUNTER — Ambulatory Visit: Payer: BLUE CROSS/BLUE SHIELD | Admitting: Clinical

## 2018-09-12 VITALS — BP 125/85 | HR 81 | Temp 98.5°F | Wt 212.0 lb

## 2018-09-12 DIAGNOSIS — Z113 Encounter for screening for infections with a predominantly sexual mode of transmission: Secondary | ICD-10-CM | POA: Diagnosis not present

## 2018-09-12 DIAGNOSIS — O0932 Supervision of pregnancy with insufficient antenatal care, second trimester: Secondary | ICD-10-CM | POA: Insufficient documentation

## 2018-09-12 DIAGNOSIS — Z3480 Encounter for supervision of other normal pregnancy, unspecified trimester: Secondary | ICD-10-CM

## 2018-09-12 DIAGNOSIS — Z124 Encounter for screening for malignant neoplasm of cervix: Secondary | ICD-10-CM

## 2018-09-12 DIAGNOSIS — Z3A22 22 weeks gestation of pregnancy: Secondary | ICD-10-CM | POA: Diagnosis not present

## 2018-09-12 DIAGNOSIS — Z3402 Encounter for supervision of normal first pregnancy, second trimester: Secondary | ICD-10-CM

## 2018-09-12 NOTE — Progress Notes (Signed)
  Subjective:   Cassandra Luna is a 23 y.o. G1P0 at 108w6d by LMP being seen today for her first obstetrical visit.  Her obstetrical history is significant for late to care. Patient does intend to breast feed. Pregnancy history fully reviewed.  Patient reports no complaints.  HISTORY: OB History  Gravida Para Term Preterm AB Living  1 0 0 0 0 0  SAB TAB Ectopic Multiple Live Births  0 0 0 0 0    # Outcome Date GA Lbr Len/2nd Weight Sex Delivery Anes PTL Lv  1 Current             Last pap smear was done never and was NA  Past Medical History:  Diagnosis Date  . Costochondritis    History reviewed. No pertinent surgical history. History reviewed. No pertinent family history. Social History   Tobacco Use  . Smoking status: Never Smoker  . Smokeless tobacco: Never Used  Substance Use Topics  . Alcohol use: No  . Drug use: Not Currently   Allergies  Allergen Reactions  . Amoxicillin     "throat swelling"  . Citrus   . Peanut-Containing Drug Products   . Penicillins   . Shellfish Allergy   . Tomato    Current Outpatient Medications on File Prior to Visit  Medication Sig Dispense Refill  . ranitidine (ZANTAC) 150 MG capsule Take 1 capsule (150 mg total) by mouth daily. (Patient not taking: Reported on 09/12/2018) 30 capsule 0   No current facility-administered medications on file prior to visit.     Review of Systems Pertinent items noted in HPI and remainder of comprehensive ROS otherwise negative.  Exam   Vitals:   09/12/18 1522  BP: 125/85  Pulse: 81  Temp: 98.5 F (36.9 C)  Weight: 212 lb (96.2 kg)   Fetal Heart Rate (bpm): 152  Physical Exam  Constitutional: She is oriented to person, place, and time. No distress.  HENT:  Head: Normocephalic.  Cardiovascular: Normal rate.  Pulmonary/Chest: Effort normal.  Abdominal: Soft. There is no abdominal tenderness. There is no rebound.  Genitourinary:    Genitourinary Comments:  External: no  lesion Vagina: small amount of white discharge Cervix: pink, smooth, no CMT Uterus: AGA    Neurological: She is alert and oriented to person, place, and time.  Skin: Skin is warm and dry.  Psychiatric: Affect normal.  Nursing note and vitals reviewed.   Assessment:   Pregnancy: G1P0 There are no active problems to display for this patient.    Plan:  1. Supervision of normal first pregnancy in second trimester - Routine care - Inheritest(R) CF/SMA Panel - Culture, OB Urine - Hemoglobinopathy Evaluation - Obstetric Panel, Including HIV - [place] MFM OB DETAIL +14 WK; Future - [facility] AMB BABYSCRIPTS SCHEDULE OPTIMIZATION - AFP TETRA   Initial labs drawn. Continue prenatal vitamins. Genetic Screening discussed, Quad screen: ordered. Ultrasound discussed; fetal anatomic survey: ordered. Problem list reviewed and updated. The nature of  - [facility] with multiple MDs and other Advanced Practice Providers was explained to patient; also emphasized that residents, students are part of our team. Routine obstetric precautions reviewed. 50% of 45 min visit spent in counseling and coordination of care. Return in about 8 weeks (around 11/07/2018).

## 2018-09-12 NOTE — Patient Instructions (Signed)
Safe Medications in Pregnancy   Acne:  Benzoyl Peroxide  Salicylic Acid   Backache/Headache:  Tylenol: 2 regular strength every 4 hours OR        2 Extra strength every 6 hours   Colds/Coughs/Allergies:  Benadryl (alcohol free) 25 mg every 6 hours as needed  Breath right strips  Claritin  Cepacol throat lozenges  Chloraseptic throat spray  Cold-Eeze- up to three times per day  Cough drops, alcohol free  Flonase (by prescription only)  Guaifenesin  Mucinex  Robitussin DM (plain only, alcohol free)  Saline nasal spray/drops  Sudafed (pseudoephedrine) & Actifed * use only after [redacted] weeks gestation and if you do not have high blood pressure  Tylenol  Vicks Vaporub  Zinc lozenges  Zyrtec   Constipation:  Colace  Ducolax suppositories  Fleet enema  Glycerin suppositories  Metamucil  Milk of magnesia  Miralax  Senokot  Smooth move tea   Diarrhea:  Kaopectate  Imodium A-D   *NO pepto Bismol   Hemorrhoids:  Anusol  Anusol HC  Preparation H  Tucks   Indigestion:  Tums  Maalox  Mylanta  Zantac  Pepcid   Insomnia:  Benadryl (alcohol free) 25mg every 6 hours as needed  Tylenol PM  Unisom, no Gelcaps   Leg Cramps:  Tums  MagGel   Nausea/Vomiting:  Bonine  Dramamine  Emetrol  Ginger extract  Sea bands  Meclizine  Nausea medication to take during pregnancy:  Unisom (doxylamine succinate 25 mg tablets) Take one tablet daily at bedtime. If symptoms are not adequately controlled, the dose can be increased to a maximum recommended dose of two tablets daily (1/2 tablet in the morning, 1/2 tablet mid-afternoon and one at bedtime).  Vitamin B6 100mg tablets. Take one tablet twice a day (up to 200 mg per day).   Skin Rashes:  Aveeno products  Benadryl cream or 25mg every 6 hours as needed  Calamine Lotion  1% cortisone cream   Yeast infection:  Gyne-lotrimin 7  Monistat 7    **If taking multiple medications, please check labels to avoid  duplicating the same active ingredients  **take medication as directed on the label  ** Do not exceed 4000 mg of tylenol in 24 hours  **Do not take medications that contain aspirin or ibuprofen         AREA PEDIATRIC/FAMILY PRACTICE PHYSICIANS  [place] ([address]) . [facility] o Chambliss, MD; Eniola, MD; Hale, MD; Hensel, MD; McDiarmid, MD; McIntyer, MD; Neal, MD; Walden, MD o [address] [place], [place] [address] o [phone] o Mon-Fri 8:30-12:30, 1:30-5:00 o Providers come to see babies at [facility] o Accepting Medicaid . [facility] o Limited providers who accept newborns: Koirala, MD; Morrow, MD; Wolters, MD o [address], [place], [place] [address] o [phone] o Mon-Fri 8:00-5:30 o Babies seen by providers at [facility] o Does NOT accept Medicaid o Please call early in hospitalization for appointment (limited availability)  . [facility] o Mulberry, MD o [address] [place], [place] [address] o [phone] o Mon, Tue, Thur, Fri 8:30-5:00, Wed 10:00-7:00 (closed 1-2pm) o Babies seen by [facility] providers o Accepting Medicaid . [facility] o Rubin, MD o [address], [place], [place] [address] o [phone] o Mon-Fri 8:30-5:00, Sat 8:30-12:00 o Provider comes to see babies at [facility] o Accepting Medicaid o Must have been referred from current patients or contacted office   prior to delivery . [facility] ([facility]) o Brown, MD; Chandler, MD; Ettefagh, MD; Grant, MD; Lester, MD; McCormick, MD; McQueen, MD; Prose, MD; Simha, MD; Stanley, MD; Stryffeler, NP; Tebben, NP o [address], [place], [place] [address] o [phone] o Mon, Tue, Thur, Fri 8:30-5:30, Wed 9:30-5:30, Sat 8:30-12:30 o Babies seen by [facility]  providers o Accepting Medicaid o Only accepting infants of first-time parents or siblings of current patients o Hospital discharge coordinator will make follow-up appointment . Jack Amos o [address], [place], [place]  [address] o [phone]   Fax - [phone] . [facility] o [address], [place], [place]  [address] o Phone - [phone]   Fax - [phone] . Shilpa Gosrani o [address], [place], [place]  [address] o [phone]  [place] ([address]) . [facility] o Bates, MD; Brassfield, MD; Cooper, Cox, MD; MD; Davis, MD; Dovico, MD; Ettefaugh, MD; Little, MD; Lowe, MD; Keiffer, MD; Melvin, MD; Sumner, MD; Williams, MD o [address], [place], [place] [address] o [phone] o Mon-Fri 8:30-5:00 (extended evenings Mon-Thur as needed), Sat-Sun 10:00-1:00 o Providers come to see babies at [facility] o Accepting Medicaid for families of first-time babies and families with all children in the household age 3 and under. Must register with office prior to making appointment (M-F only). . [facility] o Henson, NP; Knapp, MD; Lalonde, MD; Tysinger, PA o [address] [place], [place] [address] o [phone] o Mon-Fri 8:00-5:00 o Babies seen by providers at [facility] o Does NOT accept Medicaid/Commercial Insurance Only . [facility] ([facility])  o Artis, MD; Barnes, MD; Bratton, MD; Coccaro, MD; Lockett Gardner, MD; Kramer, MD; Marshall, MD; Netherton, MD; Poleto, MD; Skinner, MD o [address] [place], [place] [address] o [phone] o Mon-Fri 8:30-5:30, Sat (Oct.-Mar.) 9:00-1:00 o Babies seen by providers at [facility] o Accepting Medicaid  [place] ([address]) . [facility] o Reid, MD; Warner, MD o [address], [place], [place] [address] o [phone] o Mon-Fri 8:30-5:00, Sat  8:30-12:00 o Providers come to see babies at [facility] o Does NOT accept Medicaid . [facility] o Becker, PA; Hagler, MD; Scifres, PA; Sun, MD; Swayne, MD o [address], [place], [place] [address] o [phone] o Mon-Fri 8:00-5:00 o Babies seen by providers at [facility] o Does NOT accept Medicaid o Only accepting babies of parents who are patients o Please call early in hospitalization for appointment (limited availability) . [facility] o Clark, MD; Frye, MD; Kelleher, MD; Mack, NP; Miller, MD; O'Keller, MD; Patterson, NP; Pudlo, MD; Puzio, MD; Thomas, MD; Tucker, MD; Twiselton, MD o [address], [place], [place] [address] o [phone] o Mon-Fri 8:00-5:00, Sat 9:00-12:00 o Providers come to see babies at [facility] o Does NOT accept Medicaid  [place] ([address]) . [facility] o Limited providers accepting new patients: Brake, NP; Wharton, PA o [address], [place], [place] [address] o [phone] o Mon-Fri 8:00-5:00 o Babies seen by providers at [facility] o Does NOT accept Medicaid o Only accepting babies of parents who are patients o Please call early in hospitalization for appointment (limited availability) . [facility] o Gay, MD; Quinlan, MD o [address] [place], [place] [address]   o [phone] (press [phone] to schedule appointment) o Mon-Fri 8:00-5:00 o Providers come to see babies at [facility] o Does NOT accept Medicaid . [facility] o Mazer, MD o [address] [place], [place] [address] o [phone] o Mon-Fri 8:30-5:00 (lunch 12:30-1:00), extended hours by appointment only Wed 5:00-6:30 o Babies seen by [facility] providers o Accepting Medicaid . [facility] o Banks, MD; Jordan, MD; Koberlein, MD o [address], [place], [place] [address] o [phone] o Mon-Fri  8:00-5:00 o Babies seen by [facility] providers o Does NOT accept Medicaid . [facility] o Parker, MD; Hunter, MD; Wallace, DO o [address] [place], [place] [address] o [phone] o Mon-Fri 8:00-5:00 o Babies seen by [facility] providers o Does NOT accept Medicaid . [facility] o Brandon, PA; Brecken, PA; Christy, NP; Dees, MD; DeClaire, MD; DeWeese, MD; Hansen, NP; Mills, NP; Parrish, NP; Smoot, NP; Summer, MD; Vapne, MD o [address] [place], [place] [address] o [phone] o Mon-Fri 8:30-5:00, Sat 10:00-1:00 o Providers come to see babies at [facility] o Does NOT accept Medicaid o Free prenatal information session Tuesdays at 4:45pm . [facility] o Bouska, MD; Gordon, PA; Jeffery, PA; Weber, PA o [address] [place] [address] o [phone] o Mon-Fri 7:30-5:30 o Babies seen by [facility] providers . [facility] o [address], [place], [place]  [address] o [phone]   Fax - [phone]  [place] ([address]) . [facility] o Reese, MD o [address] [place], [place] [address] o [phone] o Mon-Thur 8:00-6:00 o Providers come to see babies at [facility] o Accepting Medicaid . [facility] o Anderson, NP; Badger, MD; Beal, PA; Spencer, PA o [address] [place], [place] [address] o [phone] o Mon-Thur 7:30-7:30, Fri 7:30-4:30 o Babies seen by [facility] providers o Accepting Medicaid . [facility] o Agbuya, MD; Klett, NP; Romgoolam, MD o [address], [place], [place] [address] o [phone] o Mon-Fri 8:30-5:00, Sat 8:30-12:00 o Providers come to see babies at [facility] o Accepting Medicaid o Must have "Meet & Greet" appointment at office prior to delivery . [facility] ([facility] o McCord, MD; Wallace, MD; Wood, MD o [address], [place], [place] [address] o [phone] o Mon-Wed 8:00-6:00, Thur-Fri 8:00-5:00, Sat 9:00-12:00 o Providers come to see babies at [facility] o Does NOT accept Medicaid o Only accepting siblings of current patients . [facility]  o [address], [place], [place]  [address] o [phone]   Fax - [phone] . [facility] o [address], [place], [place]  [address] o [phone]   Fax - [phone]  [place] ([address]) . [facility] o Cirigliano, DO; Matthews, DO o [address] [place], [place] [address] o [phone] o Mon-Fri 7:00-5:00 o Babies seen by [facility] providers o Does NOT accept Medicaid . [facility] o Briscoe, MD; Howley, PA; Moreira, PA o [address], [place], [place] [address] o [phone] o Mon-Fri 8:00-5:00 o Babies seen by [facility] providers o Accepting Medicaid . [facility] [place] [facility] o Boyd, MD; Church, PA; Jones, NP; Osborn, PA o [address], [place], [place] [address]   o [phone] o Mon-Fri 8:00-5:00 o Babies seen by providers at [facility] o Accepting Medicaid  [place] ([address]) . [facility] [place] o Wendling, DO o [address] [place], [place] [address] o [phone] o Mon-Fri 8:00-5:00 o Babies seen by [facility] providers o Does NOT accept Medicaid o Limited availability, please call early in hospitalization to schedule follow-up . [facility] o Calderon, PA; Cummings, MD; Dillard, MD; Martin, PA; Olson, MD; VanDeven, PA o [address], [place], [place] [address] o [phone] o Mon-Fri 8:30-5:00, Sat 9:00-12:00 o Babies seen by providers at  [facility] o Accepting Medicaid o Please register online then schedule online or call office o [email or web address] . [facility] ([facility] o Hunter, NP; Kumar, MD; Martin Rogers, PA o [address], [place], [place] [address] o [phone] o Mon-Fri 8:00-5:00 o Babies seen by providers at [facility] o Accepting Medicaid . [facility] ([facility] o Utuado, MD; Kristi Fleenor, NP; West, MD o [address], [place], [place] [address] o [phone] o Mon-Fri 8:00-5:30, Sat&Sun by appointment (phones open at 8:30) o Babies seen by [facility] providers o Accepting Medicaid o Must be a first-time baby or sibling of current patient . [facility] [place]  o [address], [place], [place]  [address] o [phone]   Fax - [phone]  [place] ([address]) . [facility] o Brown, PA; Cowen, PA; Rice, MD; Helton, PA; Spry, MD o [address] [place], [place] [address] o [phone] o Mon-Thur 8:00-7:00, Fri 8:00-5:00, Sat 8:00-12:00, Sun 9:00-12:00 o Babies seen by [facility] providers o Accepting Medicaid . [facility] o Coe-Goins, MD; Marshall, MD; Pierre-Louis, MD o [address], [place], [place] [address] o [phone] o Mon-Thur 8:00-5:00 o Babies seen by providers at [facility] o Accepting Medicaid . [facility] o Bratton, MD; Coe-Goins, MD; Hayes, MD; Lewis, MD; List, MD; Lott, MD; Marshall, MD; Moran, MD; O'Neal, MD; Pierre-Louis, MD; Pitonzo, MD; Scholer, MD; Spangle, MD o [address] [place], [place] [address] o [phone] o Mon-Fri 8:00-5:30, Sat (Oct.-Mar.) 9:00-1:00 o Babies seen by providers at [facility] o Accepting Medicaid o Must fill out  new patient packet, available online at [email or web address] . [facility] ([facility]) o Friddle, NP; Harris, NP; Kelly, NP; Logan, MD; Melvin, PA; Poth, MD; Ramadoss, MD; Stanton, NP o [address], [place], [place] [address] o [phone] o Mon-Thur 8:00-5:30, Fri 8:00-5:00 o Babies seen by providers at [facility] o Accepting Medicaid  [place] ([address]) . [facility] o Dixon, PA; [place], MD; Pickard, MD; Tapia, PA o [address], [place], [place] [address] o [phone] o Mon-Fri 8:00-5:00 o Babies seen by providers at [facility] o Accepting Medicaid   [place] ([address]) . [facility] [place] o Masneri, DO; Meyers, MD; Nelson, PA o [address], [place], [place] [address] o [phone] o Mon-Fri 8:00-5:00 o Babies seen by providers at [facility] o Does NOT accept Medicaid o Limited appointment availability, please call early in hospitalization  . [facility] [place] o Kunedd, DO;   McGowen, MD o [address], [place], [place] [address] o [phone] o Mon-Fri 8:00-5:00 o Babies seen by [facility] providers o Does NOT accept Medicaid . [facility] [place] o Cameron, MD; MacDonald, MD; Michaels, PA; Nayak, MD o [address] [place] [address], [place], [place] [address] o [phone] o Mon-Fri 8:00-5:00 o After hours clinic ([address] [place], [place] [address]) [phone] Mon-Fri 5:00-8:00, Sat 12:00-6:00, Sun 10:00-4:00 o Babies seen by [facility] providers o Accepting Medicaid . [facility] [place] o [address], [place], [place]  [address] o [phone]   Fax - [phone]  [place] ([address]) . [facility] [place] [facility] o Andy, MD o [address], [place], [place] [address] o [phone] o Mon-Fri 8:00-5:00 o Babies seen by [facility] o Does NOT accept Medicaid . [facility] [place] ([facility] [place]) o Eksir, MD o [address], [place], [place] [address] o [phone] o Mon-Thur 8:00-7:00, Fri 8:00-5:00, Sat 8:00-12:00 o Babies seen by providers at [facility] o Accepting Medicaid - but does not have vaccinations in office (must be received elsewhere) o Limited availability, please call early in hospitalization  [place] ([address]) . [facility]  o Charlene Flemming, MD o [address], [place] [address] o [phone]  Fax [phone]   

## 2018-09-12 NOTE — BH Specialist Note (Signed)
Integrated Behavioral Health Initial Visit  MRN: [id number] Name: Cassandra Luna  Number of Integrated Behavioral Health Clinician visits:: 1/6 Session Start time: 4:10  Session End time: 4:21 Total time: 15 minutes  Type of Service: Integrated Behavioral Health- Individual/Family Interpretor:No. Interpretor Name and Language: n/a    Warm Hand Off Completed.       SUBJECTIVE: Cassandra Luna is a 23 y.o. female accompanied by n/a Patient was referred by Thressa Sheller, CNM for Initial OB introduction to integrated behavioral health services  Patient reports the following symptoms/concerns: Pt states her primary concern today is fear of needles in childbirth, and is thinking about having a waterbirth; no other concern at this time.  Duration of problem: Current pregnancy; Severity of problem: mild  OBJECTIVE: Mood: Normal and Affect: Appropriate Risk of harm to self or others: No plan to harm self or others  LIFE CONTEXT: Family and Social: - School/Work: - Self-Care: - Life Changes: Current pregnancy   GOALS ADDRESSED: Patient will: 1. Increase knowledge and/or ability of: healthy habits   INTERVENTIONS: Interventions utilized: Psychoeducation and/or Health Education  Standardized Assessments completed: GAD-7 and PHQ 9  ASSESSMENT: Patient currently experiencing Supervision of normal pregnancy, second trimester.   Patient may benefit from Initial OB introduction to integrated behavioral health services.  PLAN: 1. Follow up with behavioral health clinician on : As needed 2. Behavioral recommendations:  -Begin taking prenatal vitamin daily, as recommended by medical provider 3. Referral(s): Integrated [employer] [facility] (In Clinic)   Valetta [address] [place], [place]  Depression screen [facility] 2/9 09/12/2018  Decreased Interest 0  Down, Depressed, Hopeless 0  PHQ - 2 Score 0  Altered sleeping 2  Tired, decreased energy 1  Change in appetite 2  Feeling bad or  failure about yourself  0  Trouble concentrating 0  Moving slowly or fidgety/restless 0  Suicidal thoughts 0  PHQ-9 Score 5   GAD 7 : Generalized Anxiety Score 09/12/2018  Nervous, Anxious, on Edge 0  Control/stop worrying 0  Worry too much - different things 0  Trouble relaxing 0  Restless 0  Easily annoyed or irritable 3  Afraid - awful might happen 0  Total GAD 7 Score 3

## 2018-09-12 NOTE — Addendum Note (Signed)
Addended by: Thressa Sheller D on: 09/12/2018 04:07 PM   Modules accepted: Orders

## 2018-09-12 NOTE — Addendum Note (Signed)
Addended by: Thressa Sheller D on: 09/12/2018 04:01 PM   Modules accepted: Orders

## 2018-09-13 LAB — CERVICOVAGINAL ANCILLARY ONLY
CHLAMYDIA, DNA PROBE: NEGATIVE
Neisseria Gonorrhea: NEGATIVE

## 2018-09-14 LAB — URINE CULTURE, OB REFLEX

## 2018-09-14 LAB — CULTURE, OB URINE

## 2018-09-16 LAB — CYTOLOGY - PAP

## 2018-09-20 ENCOUNTER — Ambulatory Visit (HOSPITAL_COMMUNITY): Payer: BLUE CROSS/BLUE SHIELD

## 2018-09-20 ENCOUNTER — Other Ambulatory Visit: Payer: Self-pay

## 2018-09-20 ENCOUNTER — Ambulatory Visit (HOSPITAL_COMMUNITY)
Admission: RE | Admit: 2018-09-20 | Discharge: 2018-09-20 | Disposition: A | Discharge status: Home / Self Care | Payer: BLUE CROSS/BLUE SHIELD | Source: Ambulatory Visit | Attending: Advanced Practice Midwife | Admitting: Advanced Practice Midwife

## 2018-09-20 DIAGNOSIS — Z3402 Encounter for supervision of normal first pregnancy, second trimester: Secondary | ICD-10-CM | POA: Insufficient documentation

## 2018-09-20 DIAGNOSIS — O99212 Obesity complicating pregnancy, second trimester: Secondary | ICD-10-CM | POA: Diagnosis not present

## 2018-09-20 DIAGNOSIS — Z3A24 24 weeks gestation of pregnancy: Secondary | ICD-10-CM

## 2018-09-20 DIAGNOSIS — O0932 Supervision of pregnancy with insufficient antenatal care, second trimester: Secondary | ICD-10-CM | POA: Diagnosis not present

## 2018-09-21 ENCOUNTER — Encounter: Payer: Self-pay | Admitting: Advanced Practice Midwife

## 2018-09-21 ENCOUNTER — Other Ambulatory Visit (HOSPITAL_COMMUNITY): Payer: Self-pay | Admitting: *Deleted

## 2018-09-21 DIAGNOSIS — Z362 Encounter for other antenatal screening follow-up: Secondary | ICD-10-CM

## 2018-09-21 DIAGNOSIS — Q249 Congenital malformation of heart, unspecified: Secondary | ICD-10-CM | POA: Insufficient documentation

## 2018-09-22 LAB — OBSTETRIC PANEL, INCLUDING HIV
Antibody Screen: NEGATIVE
Basophils Absolute: 0 10*3/uL (ref 0.0–0.2)
Basos: 0 %
EOS (ABSOLUTE): 0.2 10*3/uL (ref 0.0–0.4)
Eos: 2 %
HIV Screen 4th Generation wRfx: NONREACTIVE
Hematocrit: 34.3 % (ref 34.0–46.6)
Hemoglobin: 10.9 g/dL — ABNORMAL LOW (ref 11.1–15.9)
Hepatitis B Surface Ag: NEGATIVE
IMMATURE GRANULOCYTES: 3 %
Immature Grans (Abs): 0.4 10*3/uL — ABNORMAL HIGH (ref 0.0–0.1)
LYMPHS ABS: 1.8 10*3/uL (ref 0.7–3.1)
Lymphs: 14 %
MCH: 25.6 pg — ABNORMAL LOW (ref 26.6–33.0)
MCHC: 31.8 g/dL (ref 31.5–35.7)
MCV: 81 fL (ref 79–97)
MONOS ABS: 0.7 10*3/uL (ref 0.1–0.9)
Monocytes: 6 %
Neutrophils Absolute: 9.9 10*3/uL — ABNORMAL HIGH (ref 1.4–7.0)
Neutrophils: 75 %
Platelets: 334 10*3/uL (ref 150–450)
RBC: 4.25 x10E6/uL (ref 3.77–5.28)
RDW: 14.6 % (ref 11.7–15.4)
RPR: NONREACTIVE
Rh Factor: NEGATIVE
Rubella Antibodies, IGG: 2.12 index (ref >0.99)
WBC: 13 10*3/uL — AB (ref 3.4–10.8)

## 2018-09-22 LAB — HEMOGLOBINOPATHY EVALUATION
Ferritin: 44 ng/mL (ref 15–150)
HGB SOLUBILITY: NEGATIVE
Hgb A2 Quant: 2.2 % (ref 1.8–3.2)
Hgb A: 97.8 % (ref 96.4–98.8)
Hgb C: 0 %
Hgb F Quant: 0 % (ref 0.0–2.0)
Hgb S: 0 %
Hgb Variant: 0 %

## 2018-09-22 LAB — INHERITEST(R) CF/SMA PANEL

## 2018-09-22 LAB — AFP, SERUM, OPEN SPINA BIFIDA
AFP MoM: 1.1
AFP Value: 89.5 ng/mL
Gest. Age on Collection Date: 23.6 weeks
MATERNAL AGE AT EDD: 21.4 a
OSBR Risk 1 IN: 10000
Test Results:: NEGATIVE
Weight: 212 [lb_av]

## 2018-09-29 ENCOUNTER — Encounter (HOSPITAL_COMMUNITY): Payer: Self-pay | Admitting: Obstetrics and Gynecology

## 2018-09-30 ENCOUNTER — Telehealth (HOSPITAL_COMMUNITY): Payer: Self-pay | Admitting: Obstetrics and Gynecology

## 2018-09-30 ENCOUNTER — Other Ambulatory Visit: Payer: Self-pay | Admitting: Advanced Practice Midwife

## 2018-09-30 NOTE — Telephone Encounter (Signed)
I called Cassandra Luna and discussed my conversation with Dr. Mayer Camel 2 days ago. He had counseled her on fetal cardiac anomaly (see Care Everywhere for report). I discussed and recommended amniocentesis and the patient is undecided now. Plan is to deliver at [facility] on the recommendation of Dr. Mayer Camel. I will set up appointment with [facility] through Sherrie Mustache, MFM Coordinator.

## 2018-10-13 ENCOUNTER — Inpatient Hospital Stay (HOSPITAL_COMMUNITY)
Admission: AD | Admit: 2018-10-13 | Discharge: 2018-10-13 | Disposition: A | Discharge status: Home / Self Care | Payer: BLUE CROSS/BLUE SHIELD | Attending: Obstetrics and Gynecology | Admitting: Obstetrics and Gynecology

## 2018-10-13 ENCOUNTER — Encounter (HOSPITAL_COMMUNITY): Payer: Self-pay

## 2018-10-13 ENCOUNTER — Other Ambulatory Visit: Payer: Self-pay

## 2018-10-13 ENCOUNTER — Telehealth: Payer: Self-pay

## 2018-10-13 DIAGNOSIS — O99012 Anemia complicating pregnancy, second trimester: Secondary | ICD-10-CM | POA: Insufficient documentation

## 2018-10-13 DIAGNOSIS — Z79899 Other long term (current) drug therapy: Secondary | ICD-10-CM | POA: Diagnosis not present

## 2018-10-13 DIAGNOSIS — Z91010 Allergy to peanuts: Secondary | ICD-10-CM | POA: Diagnosis not present

## 2018-10-13 DIAGNOSIS — B3731 Acute candidiasis of vulva and vagina: Secondary | ICD-10-CM

## 2018-10-13 DIAGNOSIS — Z3A27 27 weeks gestation of pregnancy: Secondary | ICD-10-CM | POA: Diagnosis not present

## 2018-10-13 DIAGNOSIS — Q249 Congenital malformation of heart, unspecified: Secondary | ICD-10-CM | POA: Insufficient documentation

## 2018-10-13 DIAGNOSIS — Z91013 Allergy to seafood: Secondary | ICD-10-CM | POA: Insufficient documentation

## 2018-10-13 DIAGNOSIS — D649 Anemia, unspecified: Secondary | ICD-10-CM | POA: Insufficient documentation

## 2018-10-13 DIAGNOSIS — O9989 Other specified diseases and conditions complicating pregnancy, childbirth and the puerperium: Secondary | ICD-10-CM | POA: Diagnosis present

## 2018-10-13 DIAGNOSIS — B373 Candidiasis of vulva and vagina: Secondary | ICD-10-CM | POA: Diagnosis not present

## 2018-10-13 DIAGNOSIS — R109 Unspecified abdominal pain: Secondary | ICD-10-CM | POA: Diagnosis not present

## 2018-10-13 DIAGNOSIS — Z880 Allergy status to penicillin: Secondary | ICD-10-CM | POA: Diagnosis not present

## 2018-10-13 DIAGNOSIS — B9689 Other specified bacterial agents as the cause of diseases classified elsewhere: Secondary | ICD-10-CM | POA: Diagnosis not present

## 2018-10-13 DIAGNOSIS — O26892 Other specified pregnancy related conditions, second trimester: Secondary | ICD-10-CM

## 2018-10-13 DIAGNOSIS — Z3480 Encounter for supervision of other normal pregnancy, unspecified trimester: Secondary | ICD-10-CM

## 2018-10-13 DIAGNOSIS — Z91018 Allergy to other foods: Secondary | ICD-10-CM | POA: Diagnosis not present

## 2018-10-13 DIAGNOSIS — O26893 Other specified pregnancy related conditions, third trimester: Secondary | ICD-10-CM

## 2018-10-13 DIAGNOSIS — Z792 Long term (current) use of antibiotics: Secondary | ICD-10-CM | POA: Insufficient documentation

## 2018-10-13 DIAGNOSIS — O98812 Other maternal infectious and parasitic diseases complicating pregnancy, second trimester: Secondary | ICD-10-CM | POA: Insufficient documentation

## 2018-10-13 DIAGNOSIS — N760 Acute vaginitis: Secondary | ICD-10-CM

## 2018-10-13 HISTORY — DX: Anemia, unspecified: D64.9

## 2018-10-13 LAB — URINALYSIS, ROUTINE W REFLEX MICROSCOPIC
Bilirubin Urine: NEGATIVE
Glucose, UA: NEGATIVE mg/dL
Hgb urine dipstick: NEGATIVE
Ketones, ur: 5 mg/dL — AB
Nitrite: NEGATIVE
Protein, ur: NEGATIVE mg/dL
Specific Gravity, Urine: 1.016 (ref 1.005–1.030)
pH: 6 (ref 5.0–8.0)

## 2018-10-13 LAB — WET PREP, GENITAL
Sperm: NONE SEEN
Trich, Wet Prep: NONE SEEN

## 2018-10-13 MED ORDER — METRONIDAZOLE 500 MG PO TABS: 500.0000 mg | ORAL_TABLET | Freq: Two times a day (BID) | ORAL | 0 refills | Status: AC

## 2018-10-13 MED ORDER — TERCONAZOLE 0.4 % VA CREA: 1.0000 | TOPICAL_CREAM | Freq: Every day | VAGINAL | 0 refills | Status: DC

## 2018-10-13 NOTE — MAU Note (Addendum)
Pt started having pain yesterday and said it felt like her vagina was pulsating. That stopped at at 1130 last night she said she felt like she was having cramping in her abdomen. Also passed some mucous. No bleeding or LOF +FM  Pain rated 10/10 no apparent distress.

## 2018-10-13 NOTE — Telephone Encounter (Signed)
Pt had made note in My Chart about having severe pain & it has gotten worse. She wanted to know if she should go to the hospital. Called pt to ask if she is still having same issues & she said yes, so advised her to go to MAU. Pt verbalized understanding.

## 2018-10-13 NOTE — MAU Provider Note (Signed)
Chief Complaint:  Abdominal Pain   None     HPI: Cassandra Luna is a 23 y.o. G1P0 at [redacted]w[redacted]d by LMP with known fetal cardiac anomaly and plans to delivery at [facility] who presents to maternity admissions reporting abdominal cramping 8-10 times per hour starting last night.  Pain is across her entire lower abdomen, intermittent, cramping pain. It does not radiate. She reports mucous discharge since yesterday. There are no other associated symptoms. She has not tried any treatments.    HPI  Past Medical History: Past Medical History:  Diagnosis Date  . Anemia   . Costochondritis     Past obstetric history: OB History  Gravida Para Term Preterm AB Living  1            SAB TAB Ectopic Multiple Live Births               # Outcome Date GA Lbr Len/2nd Weight Sex Delivery Anes PTL Lv  1 Current             Past Surgical History: Past Surgical History:  Procedure Laterality Date  . NO PAST SURGERIES      Family History: No family history on file.  Social History: Social History   Tobacco Use  . Smoking status: Never Smoker  . Smokeless tobacco: Never Used  Substance Use Topics  . Alcohol use: No  . Drug use: Not Currently    Allergies:  Allergies  Allergen Reactions  . Amoxicillin     "throat swelling"  . Citrus   . Peanut-Containing Drug Products   . Penicillins   . Shellfish Allergy   . Tomato     Meds:  No medications prior to admission.    ROS:  Review of Systems  Constitutional: Negative for chills, fatigue and fever.  Eyes: Negative for visual disturbance.  Respiratory: Negative for shortness of breath.   Cardiovascular: Negative for chest pain.  Gastrointestinal: Positive for abdominal pain. Negative for nausea and vomiting.  Genitourinary: Positive for pelvic pain and vaginal discharge. Negative for difficulty urinating, dysuria, flank pain, vaginal bleeding and vaginal pain.  Neurological: Negative for dizziness and headaches.   Psychiatric/Behavioral: Negative.      I have reviewed patient's Past Medical Hx, Surgical Hx, Family Hx, Social Hx, medications and allergies.   Physical Exam   Patient Vitals for the past 24 hrs:  BP Temp Temp src Pulse Resp SpO2 Height Weight  10/13/18 1616 132/74 98.9 F (37.2 C) Oral 93 16 - - -  10/13/18 1358 124/73 99.2 F (37.3 C) Oral 95 16 96 % 5\' 1"  (1.549 m) 99.8 kg   Constitutional: Well-developed, well-nourished female in no acute distress.  Cardiovascular: normal rate Respiratory: normal effort GI: Abd soft, non-tender, gravid appropriate for gestational age.  MS: Extremities nontender, no edema, normal ROM Neurologic: Alert and oriented x 4.  GU: Neg CVAT.  PELVIC EXAM: Cervix pink, visually closed, without lesion, moderate amount white thick discharge, vaginal walls and external genitalia normal   Dilation: Closed Effacement (%): Thick Cervical Position: Posterior Exam by:: Lis Leftwich-Kirby CNM  FHT:  Baseline 140, moderate variability, accelerations present, no decelerations Contractions: None on toco or to palpation   Labs: Results for orders placed or performed during the hospital encounter of 10/13/18 (from the past 24 hour(s))  Urinalysis, Routine w reflex microscopic     Status: Abnormal   Collection Time: 10/13/18  2:55 PM  Result Value Ref Range   Color, Urine YELLOW YELLOW  APPearance HAZY (A) CLEAR   Specific Gravity, Urine 1.016 1.005 - 1.030   pH 6.0 5.0 - 8.0   Glucose, UA NEGATIVE NEGATIVE mg/dL   Hgb urine dipstick NEGATIVE NEGATIVE   Bilirubin Urine NEGATIVE NEGATIVE   Ketones, ur 5 (A) NEGATIVE mg/dL   Protein, ur NEGATIVE NEGATIVE mg/dL   Nitrite NEGATIVE NEGATIVE   Leukocytes,Ua MODERATE (A) NEGATIVE   RBC / HPF 0-5 0 - 5 RBC/hpf   WBC, UA 0-5 0 - 5 WBC/hpf   Bacteria, UA RARE (A) NONE SEEN   Squamous Epithelial / LPF 11-20 0 - 5   Mucus PRESENT   Wet prep, genital     Status: Abnormal   Collection Time: 10/13/18  2:56  PM  Result Value Ref Range   Yeast Wet Prep HPF POC PRESENT (A) NONE SEEN   Trich, Wet Prep NONE SEEN NONE SEEN   Clue Cells Wet Prep HPF POC PRESENT (A) NONE SEEN   WBC, Wet Prep HPF POC MANY (A) NONE SEEN   Sperm NONE SEEN    B/Negative/-- (03/16 1613)  Imaging:   MAU Course/MDM: Orders Placed This Encounter  Procedures  . Wet prep, genital  . Urinalysis, Routine w reflex microscopic  . Discharge patient    Meds ordered this encounter  Medications  . metroNIDAZOLE (FLAGYL) 500 MG tablet    Sig: Take 1 tablet (500 mg total) by mouth 2 (two) times daily for 7 days.    Dispense:  14 tablet    Refill:  0    Order Specific Question:   Supervising Provider    Answer:   ERVIN, MICHAEL L [[id number]]  . terconazole (TERAZOL 7) 0.4 % vaginal cream    Sig: Place 1 applicator vaginally at bedtime.    Dispense:  45 g    Refill:  0    Order Specific Question:   Supervising Provider    Answer:   Alysia PennaERVIN, MICHAEL L [[id number]]     NST reviewed and reactive Cervix closed/thick and no contractions on toco, no evidence of PTL Cervix rechecked and remains closed in 2+ hours in MAU Will treat for yeast and BV noted in wet prep Pt to increase PO fluids, rest, return to MAU if symptoms worsen Keep scheduled appts Pt discharge with strict return precautions.    Assessment: 1. Abdominal pain during pregnancy in third trimester   2. Cardiac anomaly   3. Supervision of other normal pregnancy, antepartum   4. Vaginal candidiasis   5. Bacterial vaginosis     Plan: Discharge home Labor precautions and fetal kick counts  Allergies as of 10/13/2018      Reactions   Amoxicillin    "throat swelling"   Citrus    Peanut-containing Drug Products    Penicillins    Shellfish Allergy    Tomato       Medication List    TAKE these medications   metroNIDAZOLE 500 MG tablet Commonly known as:  FLAGYL Take 1 tablet (500 mg total) by mouth 2 (two) times daily for 7 days.   ranitidine 150 MG  capsule Commonly known as:  ZANTAC Take 1 capsule (150 mg total) by mouth daily.   terconazole 0.4 % vaginal cream Commonly known as:  TERAZOL 7 Place 1 applicator vaginally at bedtime.       Sharen CounterLisa Leftwich-Kirby Certified Nurse-Midwife 10/13/2018 5:13 PM

## 2018-10-15 LAB — GC/CHLAMYDIA PROBE AMP (~~LOC~~) NOT AT ARMC
Chlamydia: NEGATIVE
Neisseria Gonorrhea: NEGATIVE

## 2018-10-18 ENCOUNTER — Ambulatory Visit (HOSPITAL_COMMUNITY)
Admission: RE | Admit: 2018-10-18 | Discharge: 2018-10-18 | Disposition: A | Discharge status: Home / Self Care | Payer: BLUE CROSS/BLUE SHIELD | Source: Ambulatory Visit | Attending: Obstetrics and Gynecology | Admitting: Obstetrics and Gynecology

## 2018-10-18 ENCOUNTER — Encounter (HOSPITAL_COMMUNITY): Payer: Self-pay | Admitting: *Deleted

## 2018-10-18 ENCOUNTER — Other Ambulatory Visit: Payer: Self-pay

## 2018-10-18 ENCOUNTER — Other Ambulatory Visit (HOSPITAL_COMMUNITY): Payer: Self-pay | Admitting: *Deleted

## 2018-10-18 ENCOUNTER — Ambulatory Visit (HOSPITAL_COMMUNITY): Payer: BLUE CROSS/BLUE SHIELD | Admitting: *Deleted

## 2018-10-18 DIAGNOSIS — O358XX0 Maternal care for other (suspected) fetal abnormality and damage, not applicable or unspecified: Secondary | ICD-10-CM

## 2018-10-18 DIAGNOSIS — Q249 Congenital malformation of heart, unspecified: Secondary | ICD-10-CM | POA: Insufficient documentation

## 2018-10-18 DIAGNOSIS — Z3480 Encounter for supervision of other normal pregnancy, unspecified trimester: Secondary | ICD-10-CM | POA: Diagnosis present

## 2018-10-18 DIAGNOSIS — Z362 Encounter for other antenatal screening follow-up: Secondary | ICD-10-CM | POA: Diagnosis not present

## 2018-10-18 DIAGNOSIS — O0932 Supervision of pregnancy with insufficient antenatal care, second trimester: Secondary | ICD-10-CM | POA: Diagnosis not present

## 2018-10-18 DIAGNOSIS — Z3A28 28 weeks gestation of pregnancy: Secondary | ICD-10-CM

## 2018-10-18 DIAGNOSIS — O35BXX0 Maternal care for other (suspected) fetal abnormality and damage, fetal cardiac anomalies, not applicable or unspecified: Secondary | ICD-10-CM

## 2018-10-18 DIAGNOSIS — O99212 Obesity complicating pregnancy, second trimester: Secondary | ICD-10-CM | POA: Diagnosis not present

## 2018-10-20 ENCOUNTER — Other Ambulatory Visit: Payer: Self-pay | Admitting: General Practice

## 2018-10-20 ENCOUNTER — Other Ambulatory Visit: Payer: Self-pay

## 2018-10-20 ENCOUNTER — Other Ambulatory Visit: Payer: BLUE CROSS/BLUE SHIELD

## 2018-10-20 DIAGNOSIS — Z3480 Encounter for supervision of other normal pregnancy, unspecified trimester: Secondary | ICD-10-CM

## 2018-10-21 LAB — GLUCOSE TOLERANCE, 2 HOURS W/ 1HR
Glucose, 1 hour: 123 mg/dL (ref 65–179)
Glucose, 2 hour: 126 mg/dL (ref 65–152)
Glucose, Fasting: 74 mg/dL (ref 65–91)

## 2018-11-01 ENCOUNTER — Telehealth: Payer: Self-pay | Admitting: Advanced Practice Midwife

## 2018-11-01 NOTE — Telephone Encounter (Signed)
Called the patient to inform of the virtual visit. The patient completed a 2 hour lab on 4/13. Educated the patient of the [employer] webex app. Also sent the patient a mychart message with instructions. The app was taking a moment to load however the patient stated she has it from here. Patient verbalized understanding on the process.

## 2018-11-03 ENCOUNTER — Encounter (HOSPITAL_COMMUNITY): Payer: Self-pay | Admitting: *Deleted

## 2018-11-03 ENCOUNTER — Inpatient Hospital Stay (HOSPITAL_COMMUNITY)
Admission: AD | Admit: 2018-11-03 | Discharge: 2018-11-03 | Disposition: A | Discharge status: Home / Self Care | Payer: BLUE CROSS/BLUE SHIELD | Attending: Obstetrics & Gynecology | Admitting: Obstetrics & Gynecology

## 2018-11-03 ENCOUNTER — Other Ambulatory Visit: Payer: Self-pay

## 2018-11-03 ENCOUNTER — Ambulatory Visit (INDEPENDENT_AMBULATORY_CARE_PROVIDER_SITE_OTHER): Payer: BLUE CROSS/BLUE SHIELD | Admitting: Obstetrics and Gynecology

## 2018-11-03 VITALS — BP 125/80 | HR 80

## 2018-11-03 DIAGNOSIS — Z23 Encounter for immunization: Secondary | ICD-10-CM | POA: Insufficient documentation

## 2018-11-03 DIAGNOSIS — Z3480 Encounter for supervision of other normal pregnancy, unspecified trimester: Secondary | ICD-10-CM

## 2018-11-03 DIAGNOSIS — O4693 Antepartum hemorrhage, unspecified, third trimester: Secondary | ICD-10-CM

## 2018-11-03 DIAGNOSIS — N760 Acute vaginitis: Secondary | ICD-10-CM | POA: Diagnosis not present

## 2018-11-03 DIAGNOSIS — O9989 Other specified diseases and conditions complicating pregnancy, childbirth and the puerperium: Secondary | ICD-10-CM | POA: Insufficient documentation

## 2018-11-03 DIAGNOSIS — Z6791 Unspecified blood type, Rh negative: Secondary | ICD-10-CM | POA: Insufficient documentation

## 2018-11-03 DIAGNOSIS — B9689 Other specified bacterial agents as the cause of diseases classified elsewhere: Secondary | ICD-10-CM | POA: Insufficient documentation

## 2018-11-03 DIAGNOSIS — Z3A30 30 weeks gestation of pregnancy: Secondary | ICD-10-CM | POA: Diagnosis not present

## 2018-11-03 DIAGNOSIS — B3731 Acute candidiasis of vulva and vagina: Secondary | ICD-10-CM

## 2018-11-03 DIAGNOSIS — O26893 Other specified pregnancy related conditions, third trimester: Secondary | ICD-10-CM | POA: Insufficient documentation

## 2018-11-03 DIAGNOSIS — O26853 Spotting complicating pregnancy, third trimester: Secondary | ICD-10-CM | POA: Diagnosis not present

## 2018-11-03 DIAGNOSIS — B373 Candidiasis of vulva and vagina: Secondary | ICD-10-CM

## 2018-11-03 DIAGNOSIS — O23593 Infection of other part of genital tract in pregnancy, third trimester: Secondary | ICD-10-CM | POA: Diagnosis not present

## 2018-11-03 DIAGNOSIS — O360130 Maternal care for anti-D [Rh] antibodies, third trimester, not applicable or unspecified: Secondary | ICD-10-CM

## 2018-11-03 DIAGNOSIS — Z6721 Type B blood, Rh negative: Secondary | ICD-10-CM

## 2018-11-03 LAB — CBC
HCT: 30 % — ABNORMAL LOW (ref 36.0–46.0)
Hemoglobin: 9.8 g/dL — ABNORMAL LOW (ref 12.0–15.0)
MCH: 26.2 pg (ref 26.0–34.0)
MCHC: 32.7 g/dL (ref 30.0–36.0)
MCV: 80.2 fL (ref 80.0–100.0)
Platelets: 304 10*3/uL (ref 150–400)
RBC: 3.74 MIL/uL — ABNORMAL LOW (ref 3.87–5.11)
RDW: 13.9 % (ref 11.5–15.5)
WBC: 8.9 10*3/uL (ref 4.0–10.5)
nRBC: 0 % (ref 0.0–0.2)

## 2018-11-03 LAB — WET PREP, GENITAL
Sperm: NONE SEEN
Trich, Wet Prep: NONE SEEN

## 2018-11-03 LAB — URINALYSIS, ROUTINE W REFLEX MICROSCOPIC
Bilirubin Urine: NEGATIVE
Glucose, UA: NEGATIVE mg/dL
Hgb urine dipstick: NEGATIVE
Ketones, ur: NEGATIVE mg/dL
Leukocytes,Ua: NEGATIVE
Nitrite: NEGATIVE
Protein, ur: NEGATIVE mg/dL
Specific Gravity, Urine: 1.006 (ref 1.005–1.030)
pH: 7 (ref 5.0–8.0)

## 2018-11-03 MED ORDER — RHO D IMMUNE GLOBULIN 1500 UNIT/2ML IJ SOSY
300.0000 ug | PREFILLED_SYRINGE | Freq: Once | INTRAMUSCULAR | Status: AC
  Administered 2018-11-03: 300 ug via INTRAMUSCULAR
  Filled 2018-11-03: qty 2

## 2018-11-03 MED ORDER — TERCONAZOLE 0.8 % VA CREA: 1.0000 | TOPICAL_CREAM | Freq: Every day | VAGINAL | 0 refills | Status: DC

## 2018-11-03 MED ORDER — TETANUS-DIPHTH-ACELL PERTUSSIS 5-2.5-18.5 LF-MCG/0.5 IM SUSP
0.5000 mL | Freq: Once | INTRAMUSCULAR | Status: AC
  Administered 2018-11-03: 15:00:00 0.5 mL via INTRAMUSCULAR
  Filled 2018-11-03: qty 0.5

## 2018-11-03 MED ORDER — METRONIDAZOLE 500 MG PO TABS: 500.0000 mg | ORAL_TABLET | Freq: Two times a day (BID) | ORAL | 0 refills | Status: DC

## 2018-11-03 NOTE — Discharge Instructions (Signed)
Warning Signs During Pregnancy  A pregnancy lasts about 40 weeks, starting from the first day of your last period until the baby is born. Pregnancy is divided into three phases called trimesters.  · The first trimester refers to week 1 through week 13 of pregnancy.  · The second trimester is the start of week 14 through the end of week 27.  · The third trimester is the start of week 28 until you deliver your baby.  During each trimester of pregnancy, certain signs and symptoms may indicate a problem. Talk with your health care provider about your current health and any medical conditions you have. Make sure you know the symptoms that you should watch for and report.  How does this affect me?    Warning signs in the first trimester  While some changes during the first trimester may be uncomfortable, most do not represent a serious problem. Let your health care provider know if you have any of the following warning signs in the first trimester:  · You cannot eat or drink without vomiting, and this lasts for longer than a day.  · You have vaginal bleeding or spotting along with menstrual-like cramping.  · You have diarrhea for longer than a day.  · You have a fever or other signs of infection, such as:  ? Pain or burning when you urinate.  ? Foul smelling or thick or yellowish vaginal discharge.  Warning signs in the second trimester  As your baby grows and changes during the second trimester, there are additional signs and symptoms that may indicate a problem. These include:  · Signs and symptoms of infection, including a fever.  · Signs or symptoms of a miscarriage or preterm labor, such as regular contractions, menstrual-like cramping, or lower abdominal pain.  · Bloody or watery vaginal discharge or obvious vaginal bleeding.  · Feeling like your heart is pounding.  · Having trouble breathing.  · Nausea, vomiting, or diarrhea that lasts for longer than a day.  · Craving non-food items, such as clay, chalk, or dirt.  This may be a sign of a very treatable medical condition called pica.  Later in your second trimester, watch for signs and symptoms of a serious medical condition called preeclampsia.These include:  · Changes in your vision.  · A severe headache that does not go away.  · Nausea and vomiting.  It is also important to notice if your baby stops moving or moves less than usual during this time.  Warning signs in the third trimester  As you approach the third trimester, your baby is growing and your body is preparing for the birth of your baby. In your third trimester, be sure to let your health care provider know if:  · You have signs and symptoms of infection, including a fever.  · You have vaginal bleeding.  · You notice that your baby is moving less than usual or is not moving.  · You have nausea, vomiting, or diarrhea that lasts for longer than a day.  · You have a severe headache that does not go away.  · You have vision changes, including seeing spots or having blurry or double vision.  · You have increased swelling in your hands or face.  How does this affect my baby?  Throughout your pregnancy, always report any of the warning signs of a problem to your health care provider. This can help prevent complications that may affect your baby, including:  · Increased risk   for premature birth.  · Infection that may be transmitted to your baby.  · Increased risk for stillbirth.  Contact a health care provider if:  · You have any of the warning signs of a problem for the current trimester of your pregnancy.  · Any of the following apply to you during any trimester of pregnancy:  ? You have strong emotions, such as sadness or anxiety, that interfere with work or personal relationships.  ? You feel unsafe in your home and need help finding a safe place to live.  ? You are using tobacco products, alcohol, or drugs and you need help to stop.  Get help right away if:  You have signs or symptoms of labor before 37 weeks of  pregnancy. These include:  · Contractions that are 5 minutes or less apart, or that increase in frequency, intensity, or length.  · Sudden, sharp abdominal pain or low back pain.  · Uncontrolled gush or trickle of fluid from your vagina.  Summary  · A pregnancy lasts about 40 weeks, starting from the first day of your last period until the baby is born. Pregnancy is divided into three phases called trimesters. Each trimester has warning signs to watch for.  · Always report any warning signs to your health care provider in order to prevent complications that may affect both you and your baby.  · Talk with your health care provider about your current health and any medical conditions you have. Make sure you know the symptoms that you should watch for and report.  This information is not intended to replace advice given to you by your health care provider. Make sure you discuss any questions you have with your health care provider.  Document Released: 04/01/2017 Document Revised: 04/01/2017 Document Reviewed: 04/01/2017  Elsevier Interactive Patient Education © 2019 [employer]

## 2018-11-03 NOTE — Progress Notes (Signed)
Called pt and advised her to go to MAU per provider recommendation.  Pt verbalized understanding and stated she would go.

## 2018-11-03 NOTE — Progress Notes (Addendum)
   TELEHEALTH VIRTUAL OBSTETRICS VISIT ENCOUNTER NOTE  I connected with Cassandra Luna on 11/03/18 at  8:33 AM EDT by webex at home and verified that I am speaking with the correct person using two identifiers.   I discussed the limitations, risks, security and privacy concerns of performing an evaluation and management service by telephone and the availability of in person appointments. I also discussed with the patient that there may be a patient responsible charge related to this service. The patient expressed understanding and agreed to proceed.  Subjective:  Cassandra Luna is a 23 y.o. G1P0 at [redacted]w[redacted]d being followed for ongoing prenatal care.  She is currently monitored for the following issues for this high-risk pregnancy and has Supervision of other normal pregnancy, antepartum; Late prenatal care in second trimester; Fetal cardiac anomaly; Vaginal bleeding in pregnancy, third trimester; Type B blood, Rh negative; and Need for Tdap vaccination on their problem list.  Patient reports hematuria, ?vaginal bleeding. She notices it only when she wipes.  No watery discharge. No pain. Reports fetal movement normal. Denies any contractions, or leaking of fluid.   Going to [facility] on 5/14 she has an office visit to discuss delivery at [facility].   The following portions of the patient's history were reviewed and updated as appropriate: allergies, current medications, past family history, past medical history, past social history, past surgical history and problem list.   Objective:   General:  Alert, oriented and cooperative.   Mental Status: Normal mood and affect perceived. Normal judgment and thought content.  Rest of physical exam deferred due to type of encounter  Assessment and Plan:  Pregnancy: G1P0 at [redacted]w[redacted]d  1. Type B blood, Rh negative  Patient needs rhogam today.   2. Vaginal bleeding in pregnancy, third trimester  Patient to go to MAU for further evaluation.   3. Need for Tdap  vaccination   4. Supervision of other normal pregnancy, antepartum  Overall doing well. Plans for delivery at [facility].  Glucose testing normal.  BP reviewed today, WNL     Preterm labor symptoms and general obstetric precautions including but not limited to vaginal bleeding, contractions, leaking of fluid and fetal movement were reviewed in detail with the patient.  I discussed the assessment and treatment plan with the patient. The patient was provided an opportunity to ask questions and all were answered. The patient agreed with the plan and demonstrated an understanding of the instructions. The patient was advised to call back or seek an in-person office evaluation/go to MAU at [facility] for any urgent or concerning symptoms. Please refer to After Visit Summary for other counseling recommendations.   I provided 12 minutes of non-face-to-face time during this encounter.  Return in about 2 weeks (around 11/17/2018) for For virtual visit. patient to go to MAU today. .  Future Appointments  Date Time Provider Department Center  11/15/2018  9:30 AM [facility] NURSE [facility] MFC-US  11/15/2018  9:30 AM [facility] [place] 1 [facility]-M[facility] M[facility]-US    Venia Carbon, NP Center for [employer], [facility]

## 2018-11-03 NOTE — MAU Provider Note (Signed)
Chief Complaint:  Vaginal Bleeding   First Provider Initiated Contact with Patient 11/03/18 1303     HPI: Cassandra Luna is a 23 y.o. G1P0 at [redacted]w[redacted]d who presents to maternity admissions reporting vaginal bleeding. Symptoms started a few weeks ago & occur intermittently. States at times she sees some bright red on toilet paper & sometimes she sees a little bit in the toilet but can't tell if it's vaginal or urinary. Denies vaginal discharge or irritation. Denis dysuria, n/v, fever/chills, or flank pain. No recent intercourse. Denies abdominal pain. Normal fetal movement.   Past Medical History:  Diagnosis Date  . Anemia   . Costochondritis    OB History  Gravida Para Term Preterm AB Living  1            SAB TAB Ectopic Multiple Live Births               # Outcome Date GA Lbr Len/2nd Weight Sex Delivery Anes PTL Lv  1 Current            Past Surgical History:  Procedure Laterality Date  . NO PAST SURGERIES     Family History  Problem Relation Age of Onset  . Hypertension Mother    Social History   Tobacco Use  . Smoking status: Never Smoker  . Smokeless tobacco: Never Used  Substance Use Topics  . Alcohol use: No  . Drug use: Not Currently   Allergies  Allergen Reactions  . Amoxicillin     "throat swelling"  . Shellfish Allergy Anaphylaxis  . Citrus   . Peanut-Containing Drug Products   . Penicillins   . Tomato    Medications Prior to Admission  Medication Sig Dispense Refill Last Dose  . Prenatal Vit-Fe Fumarate-FA (PRENATAL VITAMIN PO) Take by mouth.   11/03/2018 at 0935    I have reviewed patient's Past Medical Hx, Surgical Hx, Family Hx, Social Hx, medications and allergies.   ROS:  Review of Systems  Constitutional: Negative.   Gastrointestinal: Negative.   Genitourinary: Positive for hematuria (unsure if urinary or vaginal) and vaginal bleeding. Negative for dysuria, flank pain, frequency and vaginal discharge.    Physical Exam   Patient Vitals for  the past 24 hrs:  BP Temp Temp src Pulse Resp SpO2 Height Weight  11/03/18 1231 138/88 98.6 F (37 C) Oral 74 18 98 % 5\' 1"  (1.549 m) 101.7 kg    Constitutional: Well-developed, well-nourished female in no acute distress.  Cardiovascular: normal rate & rhythm, no murmur Respiratory: normal effort, lung sounds clear throughout GI: Abd soft, non-tender, gravid appropriate for gestational age. Pos BS x 4 MS: Extremities nontender, no edema, normal ROM Neurologic: Alert and oriented x 4.  GU:      Pelvic: NEFG, small amount of foul smelling thin discharge. Vaginal walls erythematous. No vaginal bleeding.   Dilation: Closed Effacement (%): Thick Cervical Position: Posterior Exam by:: Judeth Horn NP  NST:  Baseline: 145 bpm, Variability: Good {> 6 bpm), Accelerations: Reactive and Decelerations: Absent   Labs: Results for orders placed or performed during the hospital encounter of 11/03/18 (from the past 24 hour(s))  Urinalysis, Routine w reflex microscopic     Status: Abnormal   Collection Time: 11/03/18  1:20 PM  Result Value Ref Range   Color, Urine YELLOW YELLOW   APPearance HAZY (A) CLEAR   Specific Gravity, Urine 1.006 1.005 - 1.030   pH 7.0 5.0 - 8.0   Glucose, UA NEGATIVE NEGATIVE mg/dL  Hgb urine dipstick NEGATIVE NEGATIVE   Bilirubin Urine NEGATIVE NEGATIVE   Ketones, ur NEGATIVE NEGATIVE mg/dL   Protein, ur NEGATIVE NEGATIVE mg/dL   Nitrite NEGATIVE NEGATIVE   Leukocytes,Ua NEGATIVE NEGATIVE  CBC     Status: Abnormal   Collection Time: 11/03/18  1:24 PM  Result Value Ref Range   WBC 8.9 4.0 - 10.5 K/uL   RBC 3.74 (L) 3.87 - 5.11 MIL/uL   Hemoglobin 9.8 (L) 12.0 - 15.0 g/dL   HCT 16.130.0 (L) 09.636.0 - 04.546.0 %   MCV 80.2 80.0 - 100.0 fL   MCH 26.2 26.0 - 34.0 pg   MCHC 32.7 30.0 - 36.0 g/dL   RDW 40.913.9 81.111.5 - 91.415.5 %   Platelets 304 150 - 400 K/uL   nRBC 0.0 0.0 - 0.2 %  Rh IG workup (includes ABO/Rh)     Status: None (Preliminary result)   Collection Time: 11/03/18   1:24 PM  Result Value Ref Range   Gestational Age(Wks) 30    ABO/RH(D) B NEG    Fetal Screen NEG    Unit Number [id number]    Blood Component Type RHIG    Unit division 00    Status of Unit ISSUED    Transfusion Status      OK TO TRANSFUSE Performed at [facility], [address] [place], [place] [address]   Wet prep, genital     Status: Abnormal   Collection Time: 11/03/18  1:32 PM  Result Value Ref Range   Yeast Wet Prep HPF POC PRESENT (A) NONE SEEN   Trich, Wet Prep NONE SEEN NONE SEEN   Clue Cells Wet Prep HPF POC PRESENT (A) NONE SEEN   WBC, Wet Prep HPF POC MODERATE (A) NONE SEEN   Sperm NONE SEEN     Imaging:  No results found.  MAU Course: Orders Placed This Encounter  Procedures  . Wet prep, genital  . Urinalysis, Routine w reflex microscopic  . CBC  . HIV Antibody (routine testing w rflx)  . Rh IG workup (includes ABO/Rh)  . Discharge patient   Meds ordered this encounter  Medications  . rho (d) immune globulin (RHIG/RHOPHYLAC) injection 300 mcg  . Tdap (BOOSTRIX) injection 0.5 mL  . metroNIDAZOLE (FLAGYL) 500 MG tablet    Sig: Take 1 tablet (500 mg total) by mouth 2 (two) times daily.    Dispense:  14 tablet    Refill:  0    Order Specific Question:   Supervising Provider    Answer:   Adam PhenixARNOLD, JAMES G [[id number]]  . terconazole (TERAZOL 3) 0.8 % vaginal cream    Sig: Place 1 applicator vaginally at bedtime.    Dispense:  20 g    Refill:  0    Order Specific Question:   Supervising Provider    Answer:   Adam PhenixARNOLD, JAMES G [[id number]]    MDM: No VB noted on exam. RH negative & didn't receive rhogam in the office, will get today. Patient also given Tdap today as it wasn't done in the office.   Wet prep collected. + yeast & clue cells.  GC/CT negative a few weeks ago  Assessment: 1. BV (bacterial vaginosis)   2. Vaginal yeast infection   3. [redacted] weeks gestation of pregnancy   4. Rh negative state in antepartum period, third trimester      Plan: Discharge home in stable condition.  Preterm Labor precautions and fetal kick counts Rx terazol & flagyl   Allergies  as of 11/03/2018      Reactions   Amoxicillin    "throat swelling"   Shellfish Allergy Anaphylaxis   Citrus    Peanut-containing Drug Products    Penicillins    Tomato       Medication List    TAKE these medications   metroNIDAZOLE 500 MG tablet Commonly known as:  FLAGYL Take 1 tablet (500 mg total) by mouth 2 (two) times daily.   PRENATAL VITAMIN PO Take by mouth.   terconazole 0.8 % vaginal cream Commonly known as:  Terazol 3 Place 1 applicator vaginally at bedtime.       Judeth Horn, NP 11/03/2018 4:10 PM

## 2018-11-03 NOTE — MAU Note (Signed)
Presents with c/o bleeding, unsure of sourec.  Reports sometimes blood is with wiping and other times see blood in urine.  Denies LOF.  Reports +FM. Also reports here to receive injections.

## 2018-11-03 NOTE — Patient Instructions (Signed)
AREA PEDIATRIC/FAMILY PRACTICE PHYSICIANS  Central/[place]outheast [place] ([address]) . [facility] o Chambliss, MD; Eniola, MD; Hale, MD; Hensel, MD; McDiarmid, MD; McIntyer, MD; Neal, MD; Walden, MD o [address] [place], [place] [address] o [phone] o Mon-Fri 8:30-12:30, 1:30-5:00 o Providers come to see babies at [facility] o Accepting Medicaid . [facility] o Limited providers who accept newborns: Koirala, MD; Morrow, MD; Wolters, MD o [address], [place], [place] [address] o [phone] o Mon-Fri 8:00-5:30 o Babies seen by providers at [facility] o Does NOT accept Medicaid o Please call early in hospitalization for appointment (limited availability)  . [facility] o Mulberry, MD o [address] [place], [place] [address] o [phone] o Mon, Tue, Thur, Fri 8:30-5:00, Wed 10:00-7:00 (closed 1-2pm) o Babies seen by [facility] providers o Accepting Medicaid . [facility] - Pediatrician o Rubin, MD o [address], [place], [place] [address] o [phone] o Mon-Fri 8:30-5:00, Sat 8:30-12:00 o Provider comes to see babies at [facility] o Accepting Medicaid o Must have been referred from current patients or contacted office prior to delivery . [facility] ([facility]) o Brown, MD; Chandler, MD; Ettefagh, MD; Grant, MD; Lester, MD; McCormick, MD; McQueen, MD; Prose, MD; Simha, MD; Stanley, MD; Stryffeler, NP; Tebben, NP o [address], [place], [place] [address] o [phone] o Mon, Tue, Thur, Fri 8:30-5:30, Wed 9:30-5:30, Sat 8:30-12:30 o Babies seen by [facility] providers o Accepting Medicaid o Only accepting infants of first-time parents or siblings of current patients o Hospital discharge coordinator will make follow-up appointment . Jack Amos o [address],  [place], [place]  [address] o [phone]   Fax - [phone] . [facility] o [address], [place], [place]  [address] o Phone - [phone]   Fax - [phone] . Shilpa Gosrani o [address], [place], [place]  [address] o [phone]  [place] ([address]) .  [facility] o Bates, MD; Brassfield, MD; Cooper, Cox, MD; MD; Davis, MD; Dovico, MD; Ettefaugh, MD; Little, MD; Lowe, MD; Keiffer, MD; Melvin, MD; Sumner, MD; Williams, MD o [address], [place], [place] [address] o [phone] o Mon-Fri 8:30-5:00 (extended evenings Mon-Thur as needed), Sat-Sun 10:00-1:00 o Providers come to see babies at [facility] o Accepting Medicaid for families of first-time babies and families with all children in the household age 3 and under. Must register with office prior to making appointment (M-F only). . [facility] o Henson, NP; Knapp, MD; Lalonde, MD; Tysinger, PA o [address] [place], [place] [address] o [phone] o Mon-Fri 8:00-5:00 o Babies seen by providers at [facility] o Does NOT accept Medicaid/Commercial Insurance Only . [facility] ([facility])  o Artis, MD; Barnes, MD; Bratton, MD; Coccaro, MD; Lockett Gardner, MD; Kramer, MD; Marshall, MD; Netherton, MD; Poleto, MD; Skinner, MD o [address] [place], [place] [address] o [phone] o Mon-Fri 8:30-5:30, Sat (Oct.-Mar.) 9:00-1:00 o Babies seen by providers at [facility] o Accepting Medicaid  [place] ([address]) . [facility] o Reid, MD; Warner, MD o [address], ,  [address] o [phone] o Mon-Fri 8:30-5:00, Sat 8:30-12:00 o Providers come to see babies at [facility] o Does NOT accept Medicaid . [facility] o Becker, PA; Hagler, MD; Scifres, PA; Sun, MD; Swayne, MD o [address],  [place], [place] [address] o [phone] o Mon-Fri 8:00-5:00 o Babies seen by providers at [facility] o Does NOT accept Medicaid o Only accepting babies of parents who are patients o Please call early in hospitalization for appointment (limited availability) . [facility] o Clark, MD; Frye, MD; Kelleher, MD; Mack, NP; Miller, MD; O'Keller, MD; Patterson, NP; Pudlo, MD; Puzio, MD; Thomas, MD; Tucker, MD; Twiselton, MD o [address], [place], [place] [address] o [phone] o Mon-Fri 8:00-5:00, Sat 9:00-12:00 o Providers come to see babies at [facility] o Does NOT accept Medicaid  [place] ([address]) . [facility] o Limited providers accepting new patients: Brake, NP; Wharton, PA o [address], [place], [place] [address] o [phone] o Mon-Fri 8:00-5:00 o Babies seen by providers at [facility] o Does NOT accept Medicaid o Only accepting babies of parents who are patients o Please call early in hospitalization for appointment (limited availability) . [facility] o Gay, MD; Quinlan, MD o [address] [place], [place] [address] o [phone] (press [phone] to schedule appointment) o Mon-Fri 8:00-5:00 o Providers come to see babies at [facility] o Does NOT accept Medicaid . [facility] o Mazer, MD o [address] [place], [place] [address] o [phone] o Mon-Fri 8:30-5:00 (lunch 12:30-1:00), extended hours by appointment only Wed 5:00-6:30 o Babies seen by [facility] providers o Accepting Medicaid . [facility] o Banks, MD; Jordan, MD; Koberlein, MD o [address], [place], [place] [address] o [phone] o Mon-Fri 8:00-5:00 o Babies seen by [facility] providers o Does NOT accept Medicaid . [facility] o Parker, MD; Hunter, MD; Wallace, DO o [address] [place], [place]  [address] o [phone] o Mon-Fri 8:00-5:00 o Babies seen by [facility] providers o Does NOT accept Medicaid . [facility] o Brandon, PA; Brecken, PA; Christy, NP; Dees, MD; DeClaire, MD; DeWeese, MD; Hansen, NP; Mills, NP; Parrish, NP; Smoot, NP; Summer, MD; Vapne, MD o [address] [place], [place] [address] o [phone] o Mon-Fri 8:30-5:00, Sat 10:00-1:00 o Providers come to see babies at [facility] o Does NOT accept Medicaid o Free prenatal information session Tuesdays at 4:45pm . [facility] o Bouska, MD; Gordon, PA; Jeffery, PA; Weber, PA o [address] [place] [address] o [phone] o Mon-Fri 7:30-5:30 o Babies seen by [facility] providers . [facility] o [address], [place], [place]  [address] o [phone]   Fax - [phone]  [place] ([address] & [address]) . [facility] o Reese, MD o [address] [place], [place] [address] o [phone] o Mon-Thur 8:00-6:00 o Providers come to see babies at [facility] o Accepting Medicaid . [facility] o Anderson, NP; Badger, MD; Beal, PA; Spencer, PA o [address] [place], [place] [address] o [phone] o Mon-Thur 7:30-7:30, Fri 7:30-4:30 o Babies seen by [facility] providers o Accepting Medicaid . [facility] o Agbuya, MD; Klett, NP; Romgoolam, MD o [address], [place],  [address] o [phone] o Mon-Fri 8:30-5:00, Sat 8:30-12:00 o Providers come to see babies at [facility] o Accepting Medicaid o Must have "Meet & Greet" appointment at office prior to delivery . [facility] o McCord,   MD; Wallace, MD; Wood, MD o [address], [place], [place] [address] o [phone] o Mon-Wed 8:00-6:00, Thur-Fri 8:00-5:00, Sat 9:00-12:00 o Providers come to  see babies at [facility] o Does NOT accept Medicaid o Only accepting siblings of current patients . [facility]  o [address], [place], [place]  [address] o [phone]   Fax - [phone] . [facility] o [address], [place], [place]  [address] o [phone]   Fax - [phone]  [place] ([address] & [address]) . [facility] o Cirigliano, DO; Matthews, DO o [address] [place], [place] [address] o [phone] o Mon-Fri 7:00-5:00 o Babies seen by [facility] providers o Does NOT accept Medicaid . [facility] o Briscoe, MD; Howley, PA; Moreira, PA o [address], [place], [place] [address] o [phone] o Mon-Fri 8:00-5:00 o Babies seen by [facility] providers o Accepting Medicaid . [facility] o Boyd, MD; Church, PA; Jones, NP; Osborn, PA o [address], [place], [place] [address] o [phone] o Mon-Fri 8:00-5:00 o Babies seen by providers at [facility] o Accepting Medicaid  [place] ([address]) . [facility] [place] o Wendling, DO o [address] [place], [place] [address] o [phone] o Mon-Fri 8:00-5:00 o Babies seen by [facility] providers o Does NOT accept Medicaid o Limited availability, please call early in hospitalization to schedule follow-up . [facility] o Calderon, PA; Cummings, MD; Dillard, MD; Martin, PA; Olson, MD; VanDeven, PA o [address], [place], [place] [address] o [phone] o Mon-Fri 8:30-5:00, Sat 9:00-12:00 o Babies seen by providers at [facility] o Accepting Medicaid o Please register online then schedule online or call office o [email or web address] . [facility] ([facility] o Hunter, NP; Kumar, MD; Martin Rogers, PA o [address], [place], [place] [address] o [phone] o Mon-Fri 8:00-5:00 o Babies seen by providers at [facility] o Accepting Medicaid . [facility] ([facility] o Waterloo, MD; Kristi Fleenor, NP; West, MD o [address], [place], [place] [address] o [phone] o Mon-Fri 8:00-5:30, Sat&Sun by appointment (phones open at 8:30) o Babies seen by [facility] providers o Accepting Medicaid o Must be a first-time baby or sibling of current patient . [facility] [place]  o [address], [place], [place]  [address] o [phone]   Fax - [phone]  [place] ([address]) . [facility] o Brown, PA; Cowen, PA; Rice, MD; Helton, PA; Spry, MD o [address] [place], [place] [address] o [phone] o Mon-Thur 8:00-7:00, Fri 8:00-5:00, Sat 8:00-12:00, Sun 9:00-12:00 o Babies seen by [facility] providers o Accepting Medicaid . [facility] [place] o Coe-Goins, MD; Marshall, MD; Pierre-Louis, MD o [address] [place] [address], [place], [place] [address] o [phone] o Mon-Thur 8:00-5:00 o Babies seen by providers at [facility] o Accepting Medicaid . [facility] o Bratton, MD; Coe-Goins, MD; Hayes, MD; Lewis, MD; List, MD; Lott, MD; Marshall, MD; Moran, MD; O'Neal, MD; Pierre-Louis, MD; Pitonzo, MD; Scholer, MD; Spangle, MD o [address] [place],    [address] o [phone] o Mon-Fri 8:00-5:30, Sat (Oct.-Mar.) 9:00-1:00 o Babies seen by providers at [facility] o Accepting Medicaid o Must fill out new patient packet, available online at [email or web address] . [facility] ([facility]) o Friddle, NP; Harris, NP; Kelly, NP; Logan, MD;  Melvin, PA; Poth, MD; Ramadoss, MD; Stanton, NP o [address], [place], [place] [address] o [phone] o Mon-Thur 8:00-5:30, Fri 8:00-5:00 o Babies seen by providers at [facility] o Accepting Medicaid  [place] ([address]) . [facility] o Dixon, PA; Hixton, MD; Pickard, MD; Tapia, PA o [address], [place], [place] [address] o [phone] o Mon-Fri 8:00-5:00 o Babies seen by providers at [facility] o Accepting Medicaid   [place] ([address]) . [facility] [place] o Masneri, DO; Meyers, MD; Nelson, PA o [address], [place], [place] [address] o [phone] o Mon-Fri 8:00-5:00 o Babies seen by providers at [facility] o Does NOT accept Medicaid o Limited appointment availability, please call early in hospitalization  . [facility] [place] o Kunedd, DO; McGowen, MD o [address], [place], [place] [address] o [phone] o Mon-Fri 8:00-5:00 o Babies seen by [facility] providers o Does NOT accept Medicaid . [facility] [place] o Cameron, MD; MacDonald, MD; Michaels, PA; Nayak, MD o [address] [place] [address], [place], [place] [address] o [phone] o Mon-Fri 8:00-5:00 o After hours clinic ([address] [place], [place] [address]) [phone] Mon-Fri 5:00-8:00, Sat 12:00-6:00, Sun 10:00-4:00 o Babies seen by [facility] providers o Accepting Medicaid . [facility] [place] o [address], [place], [place]  [address] o [phone]   Fax - [phone]  [place] ([address]) . [facility] [place] [facility] o Andy, MD o [address], [place], [place] [address] o [phone] o Mon-Fri 8:00-5:00 o Babies seen by [facility] providers o Does NOT accept Medicaid . [facility] [place] ([facility] [place]) o Eksir, MD o [address], [place], [place]  [address] o [phone] o Mon-Thur 8:00-7:00, Fri 8:00-5:00, Sat 8:00-12:00 o Babies seen by providers at [facility] o Accepting Medicaid - but does not have vaccinations in office (must be received elsewhere) o Limited availability, please call early in hospitalization  [place] ([address]) . [facility]  o Charlene Flemming, MD o [address], [place]  [address] o [phone]  Fax [phone]   

## 2018-11-04 LAB — RH IG WORKUP (INCLUDES ABO/RH)
ABO/RH(D): B NEG
Fetal Screen: NEGATIVE
Gestational Age(Wks): 30
Unit division: 0

## 2018-11-04 LAB — HIV ANTIBODY (ROUTINE TESTING W REFLEX): HIV Screen 4th Generation wRfx: NONREACTIVE

## 2018-11-10 ENCOUNTER — Other Ambulatory Visit (HOSPITAL_COMMUNITY): Payer: Self-pay

## 2018-11-11 ENCOUNTER — Telehealth (HOSPITAL_COMMUNITY): Payer: Self-pay | Admitting: *Deleted

## 2018-11-11 NOTE — Consult Note (Signed)
Incoming call ref Care plan:  Pt to deliver at [facility] with IOL on 01-04-19.  Lyndel Safe, Cariology RN to send [facility] consult note from appt yesterday.  RN will scan into pt chart.  Pt to cont ante testing/growth U/S in [facility] until delivery.  Call with additional questions to [place] Dec [phone] office, [phone] Cell. (RN is working remotely some days and may need to be reached by cell #.)

## 2018-11-15 ENCOUNTER — Ambulatory Visit (HOSPITAL_COMMUNITY): Admission: RE | Admit: 2018-11-15 | Payer: BLUE CROSS/BLUE SHIELD | Source: Ambulatory Visit

## 2018-11-15 ENCOUNTER — Ambulatory Visit (HOSPITAL_COMMUNITY): Payer: BLUE CROSS/BLUE SHIELD | Attending: Maternal & Fetal Medicine

## 2018-11-15 ENCOUNTER — Encounter (HOSPITAL_COMMUNITY): Payer: Self-pay

## 2018-11-17 ENCOUNTER — Other Ambulatory Visit: Payer: Self-pay

## 2018-11-17 ENCOUNTER — Encounter (HOSPITAL_COMMUNITY): Payer: Self-pay | Admitting: Obstetrics and Gynecology

## 2018-11-17 ENCOUNTER — Telehealth: Payer: BLUE CROSS/BLUE SHIELD | Admitting: Obstetrics & Gynecology

## 2018-11-17 ENCOUNTER — Encounter: Payer: Self-pay | Admitting: Family Medicine

## 2018-11-17 NOTE — Progress Notes (Signed)
dnka

## 2018-11-17 NOTE — Progress Notes (Signed)
945- called patient for OB appt, no answer- left messages at both numbers to call us back asap for her appt.  1015- called patient for OB appt, no answer- left messages at both numbers to call us back to reschedule your appt.

## 2018-12-01 ENCOUNTER — Telehealth: Payer: Self-pay | Admitting: Lactation Services

## 2018-12-01 NOTE — Telephone Encounter (Signed)
Attempted to call pt in response to her My Chart Message to let her know it is recommended that she go to the Maternity Assessment Unit at [facility] to be evaluated. There was no answer, Lm for pt to go to hospital ASAP. Attempted home phone also with no answer. Will send MyChart message to pt.

## 2018-12-12 ENCOUNTER — Encounter (HOSPITAL_COMMUNITY): Payer: Self-pay

## 2018-12-12 ENCOUNTER — Other Ambulatory Visit: Payer: Self-pay

## 2018-12-12 ENCOUNTER — Inpatient Hospital Stay (HOSPITAL_COMMUNITY)
Admission: AD | Admit: 2018-12-12 | Discharge: 2018-12-12 | Disposition: A | Discharge status: Nursing Home (Includes ICF) | Payer: BC Managed Care – PPO | Attending: Obstetrics and Gynecology | Admitting: Obstetrics and Gynecology

## 2018-12-12 DIAGNOSIS — R109 Unspecified abdominal pain: Secondary | ICD-10-CM | POA: Diagnosis present

## 2018-12-12 DIAGNOSIS — O1413 Severe pre-eclampsia, third trimester: Secondary | ICD-10-CM | POA: Insufficient documentation

## 2018-12-12 DIAGNOSIS — Z1159 Encounter for screening for other viral diseases: Secondary | ICD-10-CM | POA: Insufficient documentation

## 2018-12-12 DIAGNOSIS — Z3A35 35 weeks gestation of pregnancy: Secondary | ICD-10-CM

## 2018-12-12 DIAGNOSIS — Q2542 Hypoplasia of aorta: Secondary | ICD-10-CM | POA: Insufficient documentation

## 2018-12-12 DIAGNOSIS — Q210 Ventricular septal defect: Secondary | ICD-10-CM | POA: Insufficient documentation

## 2018-12-12 DIAGNOSIS — Q2549 Other congenital malformations of aorta: Secondary | ICD-10-CM | POA: Insufficient documentation

## 2018-12-12 DIAGNOSIS — O26893 Other specified pregnancy related conditions, third trimester: Secondary | ICD-10-CM | POA: Diagnosis not present

## 2018-12-12 LAB — CBC WITH DIFFERENTIAL/PLATELET
Abs Immature Granulocytes: 0.35 10*3/uL — ABNORMAL HIGH (ref 0.00–0.07)
Basophils Absolute: 0 10*3/uL (ref 0.0–0.1)
Basophils Relative: 0 %
Eosinophils Absolute: 0.1 10*3/uL (ref 0.0–0.5)
Eosinophils Relative: 1 %
HCT: 32 % — ABNORMAL LOW (ref 36.0–46.0)
Hemoglobin: 10.4 g/dL — ABNORMAL LOW (ref 12.0–15.0)
Immature Granulocytes: 4 %
Lymphocytes Relative: 18 %
Lymphs Abs: 1.6 10*3/uL (ref 0.7–4.0)
MCH: 25.3 pg — ABNORMAL LOW (ref 26.0–34.0)
MCHC: 32.5 g/dL (ref 30.0–36.0)
MCV: 77.9 fL — ABNORMAL LOW (ref 80.0–100.0)
Monocytes Absolute: 0.7 10*3/uL (ref 0.1–1.0)
Monocytes Relative: 7 %
Neutro Abs: 6.5 10*3/uL (ref 1.7–7.7)
Neutrophils Relative %: 70 %
Platelets: 238 10*3/uL (ref 150–400)
RBC: 4.11 MIL/uL (ref 3.87–5.11)
RDW: 14.6 % (ref 11.5–15.5)
WBC: 9.3 10*3/uL (ref 4.0–10.5)
nRBC: 0 % (ref 0.0–0.2)

## 2018-12-12 LAB — URINALYSIS, ROUTINE W REFLEX MICROSCOPIC
Bilirubin Urine: NEGATIVE
Glucose, UA: NEGATIVE mg/dL
Hgb urine dipstick: NEGATIVE
Ketones, ur: NEGATIVE mg/dL
Leukocytes,Ua: NEGATIVE
Nitrite: NEGATIVE
Protein, ur: 30 mg/dL — AB
Specific Gravity, Urine: 1.01 (ref 1.005–1.030)
pH: 6 (ref 5.0–8.0)

## 2018-12-12 LAB — COMPREHENSIVE METABOLIC PANEL
ALT: 11 U/L (ref 0–44)
AST: 15 U/L (ref 15–41)
Albumin: 2.3 g/dL — ABNORMAL LOW (ref 3.5–5.0)
Alkaline Phosphatase: 268 U/L — ABNORMAL HIGH (ref 38–126)
Anion gap: 8 (ref 5–15)
BUN: 5 mg/dL — ABNORMAL LOW (ref 6–20)
CO2: 19 mmol/L — ABNORMAL LOW (ref 22–32)
Calcium: 8.9 mg/dL (ref 8.9–10.3)
Chloride: 109 mmol/L (ref 98–111)
Creatinine, Ser: 0.88 mg/dL (ref 0.44–1.00)
GFR calc Af Amer: >60 mL/min (ref >60)
GFR calc non Af Amer: >60 mL/min (ref >60)
Glucose, Bld: 78 mg/dL (ref 70–99)
Potassium: 3.7 mmol/L (ref 3.5–5.1)
Sodium: 136 mmol/L (ref 135–145)
Total Bilirubin: 0.4 mg/dL (ref 0.3–1.2)
Total Protein: 5.6 g/dL — ABNORMAL LOW (ref 6.5–8.1)

## 2018-12-12 LAB — PROTEIN / CREATININE RATIO, URINE
Creatinine, Urine: 95.68 mg/dL
Protein Creatinine Ratio: 0.52 mg/mg{Cre} — ABNORMAL HIGH (ref 0.00–0.15)
Total Protein, Urine: 50 mg/dL

## 2018-12-12 LAB — SARS CORONAVIRUS 2: SARS Coronavirus 2: NOT DETECTED

## 2018-12-12 MED ORDER — ACETAMINOPHEN 500 MG PO TABS
1000.0000 mg | ORAL_TABLET | Freq: Once | ORAL | Status: AC
  Administered 2018-12-12: 11:00:00 1000 mg via ORAL
  Filled 2018-12-12: qty 2

## 2018-12-12 MED ORDER — LACTATED RINGERS IV SOLN
INTRAVENOUS | Status: DC
  Administered 2018-12-12: 11:00:00 via INTRAVENOUS

## 2018-12-12 MED ORDER — LABETALOL HCL 5 MG/ML IV SOLN: 40.0000 mg | INTRAVENOUS | Status: DC | PRN

## 2018-12-12 MED ORDER — BETAMETHASONE SOD PHOS & ACET 6 (3-3) MG/ML IJ SUSP
12.0000 mg | Freq: Once | INTRAMUSCULAR | Status: AC
  Administered 2018-12-12: 13:00:00 12 mg via INTRAMUSCULAR
  Filled 2018-12-12: qty 2

## 2018-12-12 MED ORDER — HYDRALAZINE HCL 20 MG/ML IJ SOLN: 10.0000 mg | INTRAMUSCULAR | Status: DC | PRN

## 2018-12-12 MED ORDER — MAGNESIUM SULFATE 40 G IN LACTATED RINGERS - SIMPLE
2.0000 g/h | INTRAVENOUS | Status: DC
  Administered 2018-12-12: 13:00:00 2 g/h via INTRAVENOUS
  Filled 2018-12-12: qty 500

## 2018-12-12 MED ORDER — LABETALOL HCL 5 MG/ML IV SOLN
20.0000 mg | INTRAVENOUS | Status: DC | PRN
  Administered 2018-12-12: 14:00:00 20 mg via INTRAVENOUS
  Filled 2018-12-12: qty 4

## 2018-12-12 MED ORDER — MAGNESIUM SULFATE BOLUS VIA INFUSION
4.0000 g | Freq: Once | INTRAVENOUS | Status: AC
  Administered 2018-12-12: 12:00:00 4 g via INTRAVENOUS
  Filled 2018-12-12: qty 500

## 2018-12-12 MED ORDER — HYDRALAZINE HCL 20 MG/ML IJ SOLN
5.0000 mg | INTRAMUSCULAR | Status: DC | PRN
  Administered 2018-12-12: 5 mg via INTRAVENOUS
  Filled 2018-12-12: qty 1

## 2018-12-12 NOTE — MAU Note (Signed)
Pt presents to MAU with c/o lower abdominal cramping that comes and goes that started this morning around 0730. Pt denies VB and LOF. She has noticed that the baby has not moved as much today.

## 2018-12-12 NOTE — MAU Provider Note (Signed)
History     CSN: [id number]  Arrival date and time: 12/12/18 [id number]   First Provider Initiated Contact with Patient 12/12/18 1029      Chief Complaint  Patient presents with  . Abdominal Pain  . Decreased Fetal Movement   HPI   Ms.Roseanne Renoatayanna Wirick is 23 y.o. female G1P0 @ 469w6d with a history of fetal VSD [facility] with contractions that started around 0745 am today. She also attests to a frontal HA that started this morning around the same time as the cramps. She currently rates her cramping/contractions 10/10. The pain is in her lower abdomen. The pain comes and goes and wraps around to her back. She denies a history of HTN, and denies migraines/HA. She has noticed some increased swelling over the last few weeks. No bleeding. + fetal movement.   OB History    Gravida  1   Para      Term      Preterm      AB      Living        SAB      TAB      Ectopic      Multiple      Live Births              Past Medical History:  Diagnosis Date  . Anemia   . Costochondritis     Past Surgical History:  Procedure Laterality Date  . NO PAST SURGERIES      Family History  Problem Relation Age of Onset  . Hypertension Mother     Social History   Tobacco Use  . Smoking status: Never Smoker  . Smokeless tobacco: Never Used  Substance Use Topics  . Alcohol use: No  . Drug use: Not Currently    Allergies:  Allergies  Allergen Reactions  . Amoxicillin     "throat swelling"  . Shellfish Allergy Anaphylaxis  . Citrus   . Peanut-Containing Drug Products   . Penicillins   . Tomato     Medications Prior to Admission  Medication Sig Dispense Refill Last Dose  . metroNIDAZOLE (FLAGYL) 500 MG tablet Take 1 tablet (500 mg total) by mouth 2 (two) times daily. 14 tablet 0   . Prenatal Vit-Fe Fumarate-FA (PRENATAL VITAMIN PO) Take by mouth.     . terconazole (TERAZOL 3) 0.8 % vaginal cream Place 1 applicator vaginally at bedtime. 20 g 0     Results for orders placed or performed during the hospital encounter of 12/12/18 (from the past 48 hour(s))  Urinalysis, Routine w reflex microscopic     Status: Abnormal   Collection Time: 12/12/18 10:11 AM  Result Value Ref Range   Color, Urine YELLOW YELLOW   APPearance CLEAR CLEAR   Specific Gravity, Urine 1.010 1.005 - 1.030   pH 6.0 5.0 - 8.0   Glucose, UA NEGATIVE NEGATIVE mg/dL   Hgb urine dipstick NEGATIVE NEGATIVE   Bilirubin Urine NEGATIVE NEGATIVE   Ketones, ur NEGATIVE NEGATIVE mg/dL   Protein, ur 30 (A) NEGATIVE mg/dL   Nitrite NEGATIVE NEGATIVE   Leukocytes,Ua NEGATIVE NEGATIVE   RBC / HPF 0-5 0 - 5 RBC/hpf   WBC, UA 0-5 0 - 5 WBC/hpf   Bacteria, UA RARE (A) NONE SEEN   Squamous Epithelial / LPF 6-10 0 - 5   Mucus PRESENT     Comment: Performed at [facility], [address] [place], [place] [address]  Protein /  creatinine ratio, urine     Status: Abnormal   Collection Time: 12/12/18 10:11 AM  Result Value Ref Range   Creatinine, Urine 95.68 mg/dL   Total Protein, Urine 50 mg/dL    Comment: NO NORMAL RANGE ESTABLISHED FOR THIS TEST   Protein Creatinine Ratio 0.52 (H) 0.00 - 0.15 mg/mg[Cre]    Comment: Performed at [facility], [address] [place], [place] [address]  Comprehensive metabolic panel     Status: Abnormal   Collection Time: 12/12/18 10:23 AM  Result Value Ref Range   Sodium 136 135 - 145 mmol/L   Potassium 3.7 3.5 - 5.1 mmol/L   Chloride 109 98 - 111 mmol/L   CO2 19 (L) 22 - 32 mmol/L   Glucose, Bld 78 70 - 99 mg/dL   BUN 5 (L) 6 - 20 mg/dL   Creatinine, Ser 4.090.88 0.44 - 1.00 mg/dL   Calcium 8.9 8.9 - 81.110.3 mg/dL   Total Protein 5.6 (L) 6.5 - 8.1 g/dL   Albumin 2.3 (L) 3.5 - 5.0 g/dL   AST 15 15 - 41 U/L   ALT 11 0 - 44 U/L   Alkaline Phosphatase 268 (H) 38 - 126 U/L   Total Bilirubin 0.4 0.3 - 1.2 mg/dL   GFR calc non Af Amer >60 >60 mL/min   GFR calc Af Amer >60 >60 mL/min   Anion gap 8 5 - 15    Comment: Performed at  [facility], [address] [place], [place] [address]  CBC with Differential/Platelet     Status: Abnormal   Collection Time: 12/12/18 10:23 AM  Result Value Ref Range   WBC 9.3 4.0 - 10.5 K/uL   RBC 4.11 3.87 - 5.11 MIL/uL   Hemoglobin 10.4 (L) 12.0 - 15.0 g/dL   HCT 29.532.0 (L) 62.136.0 - 30.846.0 %   MCV 77.9 (L) 80.0 - 100.0 fL   MCH 25.3 (L) 26.0 - 34.0 pg   MCHC 32.5 30.0 - 36.0 g/dL   RDW 65.714.6 84.611.5 - 96.215.5 %   Platelets 238 150 - 400 K/uL   nRBC 0.0 0.0 - 0.2 %   Neutrophils Relative % 70 %   Neutro Abs 6.5 1.7 - 7.7 K/uL   Lymphocytes Relative 18 %   Lymphs Abs 1.6 0.7 - 4.0 K/uL   Monocytes Relative 7 %   Monocytes Absolute 0.7 0.1 - 1.0 K/uL   Eosinophils Relative 1 %   Eosinophils Absolute 0.1 0.0 - 0.5 K/uL   Basophils Relative 0 %   Basophils Absolute 0.0 0.0 - 0.1 K/uL   Immature Granulocytes 4 %   Abs Immature Granulocytes 0.35 (H) 0.00 - 0.07 K/uL    Comment: Performed at [facility], [address] [place], [place] [address]   Review of Systems  Eyes: Negative for photophobia and visual disturbance.  Neurological: Positive for headaches.   Physical Exam   Blood pressure (!) 162/100, pulse 68, temperature 98.7 F (37.1 C), temperature source Oral, resp. rate 18, weight 104.8 kg, last menstrual period 04/05/2018, SpO2 98 %.   Patient Vitals for the past 24 hrs:  BP Temp Temp src Pulse Resp SpO2 Weight  12/12/18 1207 (!) 155/98 - - 68 - - -  12/12/18 1146 (!) 162/100 - - 68 - - -  12/12/18 1130 (!) 148/95 - - 70 - - -  12/12/18 1115 (!) 142/108 - - 72 - - -  12/12/18 1056 (!) 163/101 - - 71 - - -  12/12/18 1031 (!) 153/108 - - 78 - - -  12/12/18 1017 (!) 155/85 98.7 F (37.1 C) Oral 75 18 - -  12/12/18 1012 - - - - - 98 % -  12/12/18 1000 - - - - - - 104.8 kg    Physical Exam  Constitutional: She is oriented to person, place, and time. She appears well-developed and well-nourished. No distress.  HENT:  Head: Normocephalic.  Eyes: Pupils are  equal, round, and reactive to light.  Respiratory: Effort normal.  GI: Soft. She exhibits no distension. There is no abdominal tenderness. There is no rebound.  Genitourinary:    Genitourinary Comments: Dilation: Closed Effacement (%): Thick Cervical Position: Anterior Exam by:: , NP   Musculoskeletal: Normal range of motion.        General: Edema present.  Neurological: She is alert and oriented to person, place, and time. She displays normal reflexes.  Reflex Scores:      Patellar reflexes are 3+ on the right side and 3+ on the left side. Negative clonus   Skin: Skin is warm. She is not diaphoretic.   Fetal Tracing: Baseline: 125 bpm Variability: Moderate  Accelerations: 15x15 Decelerations: None Toco: occasional   MAU Course  Procedures  None  MDM  PIH labs Discussed patient with Dr. Ilda Basset BMZ given Pre E protocol initiated Magnesium started in MAU.  Tylenol 1 gram given, patient now rates her HA 0/10  Assessment and Plan   A:  1. Severe preeclampsia, third trimester   2. VSD (ventricular septal defect and aortic arch hypoplasia   3. [redacted] weeks gestation of pregnancy      P:  Transfer to [facility] for management Magnesium started BMZ given Patient informed of plan of care.  RN to arrange transport.   Lezlie Lye, NP 12/12/2018 12:23 PM

## 2018-12-12 NOTE — MAU Note (Signed)
Spoke with Dr. Ilda Basset, Accepting physician is Dr. Jonni Sanger at [facility] and [facility]. At 1256 spoke with Fran Lowes Labor and Delivery Charge nurse gave her full report on pt they would be getting from our facility.

## 2018-12-12 NOTE — MAU Note (Signed)
Provider made aware of severe range blood pressure, advised to wait until she consults with MD.

## 2018-12-12 NOTE — MAU Note (Signed)
Spoke with Cheri Rous with [facility], they will be transferring pt to [facility]. Gave full report on medications given, reason for transport and pt current status.

## 2018-12-13 MED ORDER — BEAUTY LOTION EX
30.00 | CUTANEOUS | Status: DC
Start: ? — End: 2018-12-13

## 2018-12-13 MED ORDER — SODIUM CHLORIDE 0.9 % IV SOLN
INTRAVENOUS | Status: DC
Start: ? — End: 2018-12-13

## 2018-12-13 MED ORDER — CALCIUM-VITAMIN D-VITAMIN K 600-1000-90 MG-UNT-MCG PO TABS
50.00 | ORAL_TABLET | ORAL | Status: DC
Start: ? — End: 2018-12-13

## 2018-12-13 MED ORDER — EQUATE NICOTINE 4 MG MT GUM
4.00 | CHEWING_GUM | OROMUCOSAL | Status: DC
Start: ? — End: 2018-12-13

## 2018-12-13 MED ORDER — MISOPROSTOL 200 MCG PO TABS
1000.00 | ORAL_TABLET | ORAL | Status: DC
Start: ? — End: 2018-12-13

## 2018-12-13 MED ORDER — OXYTOCIN-SODIUM CHLORIDE 20-0.9 UT/500ML-% IV SOLN
10.00 | INTRAVENOUS | Status: DC
Start: ? — End: 2018-12-13

## 2018-12-13 MED ORDER — PEDI-PRE TAPE SPRAY EX LIQD
0.20 | CUTANEOUS | Status: DC
Start: ? — End: 2018-12-13

## 2018-12-13 MED ORDER — ACETAMINOPHEN 325 MG PO TABS
975.00 | ORAL_TABLET | ORAL | Status: DC
Start: ? — End: 2018-12-13

## 2018-12-13 MED ORDER — CHICKEN MEAT (DIAGNOSTIC) IJ
2.00 | INTRAMUSCULAR | Status: DC
Start: ? — End: 2018-12-13

## 2018-12-13 MED ORDER — LIDOCAINE HCL 1 % IJ SOLN
20.00 | INTRAMUSCULAR | Status: DC
Start: ? — End: 2018-12-13

## 2018-12-13 MED ORDER — CHICKEN MEAT (DIAGNOSTIC) IJ
0.50 | INTRAMUSCULAR | Status: DC
Start: ? — End: 2018-12-13

## 2018-12-13 MED ORDER — EQL PAIN RELIEF/COLD 30-15-200-325 MG PO TABS
1.00 | ORAL_TABLET | ORAL | Status: DC
Start: ? — End: 2018-12-13

## 2018-12-13 MED ORDER — NEOMYCIN-POLYMYXIN-HC EX
250.00 | CUTANEOUS | Status: DC
Start: ? — End: 2018-12-13

## 2018-12-13 MED ORDER — Medication
1.50 | Status: DC
Start: 2018-12-13 — End: 2018-12-13

## 2018-12-20 MED ORDER — ONDANSETRON HCL 4 MG/2ML IJ SOLN
4.00 | INTRAMUSCULAR | Status: DC
Start: ? — End: 2018-12-20

## 2018-12-20 MED ORDER — GENERIC EXTERNAL MEDICATION
Status: DC
Start: ? — End: 2018-12-20

## 2018-12-20 MED ORDER — ALUM & MAG HYDROXIDE-SIMETH 400-400-40 MG/5ML PO SUSP
15.00 | ORAL | Status: DC
Start: ? — End: 2018-12-20

## 2018-12-20 MED ORDER — PNV PRENATAL PLUS MULTIVITAMIN 27-1 MG PO TABS
1.00 | ORAL_TABLET | ORAL | Status: DC
Start: 2018-12-21 — End: 2018-12-20

## 2018-12-20 MED ORDER — ACETAMINOPHEN 325 MG PO TABS
975.00 | ORAL_TABLET | ORAL | Status: DC
Start: 2018-12-20 — End: 2018-12-20

## 2018-12-20 MED ORDER — FERROUS SULFATE 324 (65 FE) MG PO TBEC
324.00 | DELAYED_RELEASE_TABLET | ORAL | Status: DC
Start: 2018-12-21 — End: 2018-12-20

## 2018-12-20 MED ORDER — DOCUSATE SODIUM 100 MG PO CAPS
100.00 | ORAL_CAPSULE | ORAL | Status: DC
Start: 2018-12-20 — End: 2018-12-20

## 2018-12-20 MED ORDER — SIMETHICONE 80 MG PO CHEW
80.00 | CHEWABLE_TABLET | ORAL | Status: DC
Start: ? — End: 2018-12-20

## 2018-12-20 MED ORDER — GENERIC EXTERNAL MEDICATION
500.00 | Status: DC
Start: 2018-12-21 — End: 2018-12-20

## 2018-12-20 MED ORDER — OXYCODONE HCL 5 MG PO TABS
5.00 | ORAL_TABLET | ORAL | Status: DC
Start: ? — End: 2018-12-20

## 2018-12-20 MED ORDER — IBUPROFEN 600 MG PO TABS
600.00 | ORAL_TABLET | ORAL | Status: DC
Start: 2018-12-20 — End: 2018-12-20

## 2018-12-20 MED ORDER — OXYCODONE HCL 5 MG PO TABS
10.00 | ORAL_TABLET | ORAL | Status: DC
Start: ? — End: 2018-12-20

## 2018-12-20 MED ORDER — ASPIRIN 81 MG PO CHEW
81.00 | CHEWABLE_TABLET | ORAL | Status: DC
Start: 2018-12-21 — End: 2018-12-20

## 2018-12-20 MED ORDER — GENERIC EXTERNAL MEDICATION
0.25 | Status: DC
Start: ? — End: 2018-12-20

## 2019-01-12 ENCOUNTER — Telehealth: Payer: Self-pay | Admitting: Obstetrics & Gynecology

## 2019-01-12 NOTE — Telephone Encounter (Signed)
Attempted to call patient about her appointment, and how she needed to have her MyChart app downloaded in order to have this visit. A voicemail was left. °

## 2019-01-13 ENCOUNTER — Other Ambulatory Visit: Payer: Self-pay

## 2019-01-13 ENCOUNTER — Telehealth (INDEPENDENT_AMBULATORY_CARE_PROVIDER_SITE_OTHER): Payer: BC Managed Care – PPO | Admitting: Obstetrics & Gynecology

## 2019-01-13 MED ORDER — ESCITALOPRAM OXALATE 20 MG PO TABS: 20.0000 mg | ORAL_TABLET | Freq: Every day | ORAL | 6 refills | Status: DC

## 2019-01-13 NOTE — Progress Notes (Signed)
I connected with  Cassandra Luna on 01/13/19 at 10:55 AM EDT by telephone and verified that I am speaking with the correct person using two identifiers.   I discussed the limitations, risks, security and privacy concerns of performing an evaluation and management service by telephone and the availability of in person appointments. I also discussed with the patient that there may be a patient responsible charge related to this service. The patient expressed understanding and agreed to proceed.  Alric Seton, [place] 01/13/2019  10:57 AM    Cassandra Luna is a 23 y.o. female who presents for a postpartum visit. She is 6 weeks postpartum following a cesarean section. I have fully reviewed the prenatal and intrapartum course. The delivery was at 36.2 gestational weeks. Outcome: primary cesarean section, low transverse incision. Anesthesia: spinal. Postpartum course has been unremarkable. Baby's course has been at [facility] NICU. Baby is feeding by breast. Bleeding staining only. Bowel function is normal. Bladder function is normal. Patient is not sexually active. Contraception method is Nexplanon done at [facility] after delivery. Postpartum depression screening: Positive 23

## 2019-01-13 NOTE — Progress Notes (Signed)
TELEHEALTH POSTPARTUM VIRTUAL VIDEO VISIT ENCOUNTER NOTE   [facility]   I connected with Cassandra Luna on 01/13/19 at 10:55 AM EDT by Chart Video Encounter at home and verified that I am speaking withe correct person using two identifiers.    I discussed the limitations, risks, security and privacy concerns of performing an evaluation and management service virtually and the availability of in person appointments. I also discussed with the patient that there may be a patient responsible charge related to this service. The patient expressed understanding and agreed to proceed.  Chief Complaint: Postpartum Visit  History of Present Illness: Cassandra Luna is a 23 y.o. African-American G1P0 being evaluated for postpartum followup.    She is s/p primary cesarean section on  At [facility] due to fetal cardiac anomaly at 36 weeks, baby had open heart surgery at 1 week of age, has a trach in now.   Complains of feeling stressed and sad. She denies SI and HI.    Patient Active Problem List   Diagnosis Date Noted  . Vaginal bleeding in pregnancy, third trimester 11/03/2018  . Type B blood, Rh negative 11/03/2018  . Need for Tdap vaccination 11/03/2018  . Fetal cardiac anomaly 09/21/2018  . Supervision of other normal pregnancy, antepartum 09/12/2018  . Late prenatal care in second trimester 09/12/2018    Medications Cassandra Luna had no medications administered during this visit. Current Outpatient Medications  Medication Sig Dispense Refill  . metroNIDAZOLE (FLAGYL) 500 MG tablet Take 1 tablet (500 mg total) by mouth 2 (two) times daily. (Patient not taking: Reported on 01/13/2019) 14 tablet 0  . Prenatal Vit-Fe Fumarate-FA (PRENATAL VITAMIN PO) Take by mouth.    . terconazole (TERAZOL 3) 0.8 % vaginal cream Place 1 applicator vaginally at bedtime. (Patient not taking: Reported on 01/13/2019) 20 g 0   No current  facility-administered medications for this visit.     Allergies Amoxicillin, Shellfish allergy, Citrus, Peanut-containing drug products, Penicillins, and Tomato  Physical Exam:  LMP 04/05/2018  Reviewed from Babyscripts General:  Alert, oriented and cooperative. Patient is in no acute distress.  Mental Status: Normal mood and affect. Normal behavior. Normal judgment and thought content.   Respiratory: Normal respiratory effort noted, no problems with respiration noted  Rest of physical exam deferred due to type of encounter  PP Depression Screening:   Edinburgh Postnatal Depression Scale Screening Tool 01/13/2019  I have been able to laugh and see the funny side of things. 1  I have looked forward with enjoyment to things. 1  I have blamed myself unnecessarily when things went wrong. 3  I have been anxious or worried for no good reason. 3  I have felt scared or panicky for no good reason. 3  Things have been getting on top of me. 3  I have been so unhappy that I have had difficulty sleeping. 3  I have felt sad or miserable. 3  I have been so unhappy that I have been crying. 3  The thought of harming myself has occurred to me. 0  Edinburgh Postnatal Depression Scale Total 23     Assessment:   1. Postpartum exam  2. LGSIL pap 3/20- come in for colpo  3. Postpartum depression - start lexapro 20 mg qhs  - Ambulatory referral to Integrated Behavioral Health   I discussed the assessment and treatment plan with the patient. The patient was provided an opportunity to ask questions and all  were answered. The patient agreed with the plan and demonstrated an understanding of the instructions.   The patient was advised to call back or seek an in-person evaluation/go to the ED for any concerning postpartum symptoms.  I provided 55minutes of face-to-face time during this encounter.   Emily Filbert, MD [facility] [employer] [facility], [place]

## 2019-01-16 ENCOUNTER — Other Ambulatory Visit: Payer: Self-pay

## 2019-01-16 ENCOUNTER — Ambulatory Visit: Payer: BC Managed Care – PPO | Admitting: Clinical

## 2019-01-16 DIAGNOSIS — Z5329 Procedure and treatment not carried out because of patient's decision for other reasons: Secondary | ICD-10-CM

## 2019-01-16 DIAGNOSIS — Z91199 Patient's noncompliance with other medical treatment and regimen due to unspecified reason: Secondary | ICD-10-CM

## 2019-01-16 NOTE — BH Specialist Note (Signed)
Pt did not arrive to video visit and did not answer the phone; Left HIPPA-compliant message to call back Roselyn Reef from [facility] at [phone], and left MyChart message for patient.   [place] via Telemedicine Video Visit  01/16/2019 Farida Mcreynolds [id number]  Garlan Fair

## 2019-01-19 NOTE — Telephone Encounter (Signed)
Telephone call  

## 2019-02-06 ENCOUNTER — Telehealth: Payer: BC Managed Care – PPO

## 2019-02-06 ENCOUNTER — Ambulatory Visit (HOSPITAL_COMMUNITY)
Admission: EM | Admit: 2019-02-06 | Discharge: 2019-02-06 | Disposition: A | Discharge status: Home / Self Care | Payer: BC Managed Care – PPO | Attending: Internal Medicine | Admitting: Internal Medicine

## 2019-02-06 ENCOUNTER — Encounter (HOSPITAL_COMMUNITY): Payer: Self-pay

## 2019-02-06 ENCOUNTER — Ambulatory Visit (INDEPENDENT_AMBULATORY_CARE_PROVIDER_SITE_OTHER): Payer: BC Managed Care – PPO

## 2019-02-06 ENCOUNTER — Other Ambulatory Visit: Payer: Self-pay

## 2019-02-06 DIAGNOSIS — S9031XA Contusion of right foot, initial encounter: Secondary | ICD-10-CM

## 2019-02-06 MED ORDER — IBUPROFEN 600 MG PO TABS: 600.0000 mg | ORAL_TABLET | Freq: Four times a day (QID) | ORAL | 0 refills | Status: AC | PRN

## 2019-02-06 NOTE — ED Triage Notes (Signed)
Pt presents with right foot pain and swelling after her car door slammed back on her foot last night

## 2019-02-06 NOTE — ED Provider Notes (Signed)
[facility]    CSN: [id number] Arrival date & time: 02/06/19  1910     History   Chief Complaint Chief Complaint  Patient presents with  . Foot Pain    HPI Cassandra Luna is a 23 y.o. female with no past medical history comes to the urgent care with right foot pain which started after she slammed a car door into her foot.  Patient says incident happened last night.  Pain is throbbing, constant, worsened with bearing weight.  She has not tried any over-the-counter medications.  Patient denies any deformity, numbness of the right foot.  Patient can hardly bear weight on that foot.  No fever or chills. HPI  Past Medical History:  Diagnosis Date  . Anemia   . Costochondritis     Patient Active Problem List   Diagnosis Date Noted  . Vaginal bleeding in pregnancy, third trimester 11/03/2018  . Type B blood, Rh negative 11/03/2018  . Need for Tdap vaccination 11/03/2018  . Fetal cardiac anomaly 09/21/2018  . Supervision of other normal pregnancy, antepartum 09/12/2018  . Late prenatal care in second trimester 09/12/2018    Past Surgical History:  Procedure Laterality Date  . NO PAST SURGERIES      OB History    Gravida  1   Para      Term      Preterm      AB      Living        SAB      TAB      Ectopic      Multiple      Live Births               Home Medications    Prior to Admission medications   Medication Sig Start Date End Date Taking? Authorizing Provider  escitalopram (LEXAPRO) 20 MG tablet Take 1 tablet (20 mg total) by mouth daily. 01/13/19   Allie Bossierove, Myra C, MD  ibuprofen (ADVIL) 600 MG tablet Take 1 tablet (600 mg total) by mouth every 6 (six) hours as needed. 02/06/19   Lamptey, Britta MccreedyPhilip O, MD  Prenatal Vit-Fe Fumarate-FA (PRENATAL VITAMIN PO) Take by mouth.    [provider]    Family History Family History  Problem Relation Age of Onset  . Hypertension Mother     Social History Social History   Tobacco  Use  . Smoking status: Never Smoker  . Smokeless tobacco: Never Used  Substance Use Topics  . Alcohol use: No  . Drug use: Not Currently     Allergies   Amoxicillin, Shellfish allergy, Citrus, Peanut-containing drug products, Penicillins, and Tomato   Review of Systems Review of Systems  Constitutional: Negative.   HENT: Negative.   Respiratory: Negative.   Gastrointestinal: Negative.   Musculoskeletal: Positive for arthralgias and myalgias. Negative for back pain, gait problem, joint swelling, neck pain and neck stiffness.  Skin: Negative for color change, pallor, rash and wound.  Neurological: Negative.   Psychiatric/Behavioral: Negative.      Physical Exam Triage Vital Signs ED Triage Vitals  Enc Vitals Group     BP 02/06/19 1929 (!) 142/82     Pulse Rate 02/06/19 1929 71     Resp 02/06/19 1929 18     Temp 02/06/19 1929 98.1 F (36.7 C)     Temp Source 02/06/19 1929 Oral     SpO2 02/06/19 1929 100 %     Weight --      Height --  Head Circumference --      Peak Flow --      Pain Score 02/06/19 1930 4     Pain Loc --      Pain Edu? --      Excl. in GC? --    No data found.  Updated Vital Signs BP (!) 142/82 (BP Location: Left Arm)   Pulse 71   Temp 98.1 F (36.7 C) (Oral)   Resp 18   LMP 04/05/2018   SpO2 100%   Breastfeeding Unknown   Visual Acuity Right Eye Distance:   Left Eye Distance:   Bilateral Distance:    Right Eye Near:   Left Eye Near:    Bilateral Near:     Physical Exam Constitutional:      General: She is in acute distress.     Appearance: Normal appearance. She is not ill-appearing or toxic-appearing.  Cardiovascular:     Rate and Rhythm: Normal rate and regular rhythm.     Pulses: Normal pulses.     Heart sounds: Normal heart sounds. No murmur. No gallop.   Pulmonary:     Effort: Pulmonary effort is normal. No respiratory distress.     Breath sounds: No wheezing.  Abdominal:     General: Bowel sounds are normal. There  is no distension.     Palpations: Abdomen is soft.     Tenderness: There is no abdominal tenderness. There is no guarding.  Musculoskeletal:        General: Swelling, tenderness, deformity and signs of injury present.  Skin:    General: Skin is warm and dry.     Capillary Refill: Capillary refill takes less than 2 seconds.  Neurological:     General: No focal deficit present.     Mental Status: She is alert. She is disoriented.      UC Treatments / Results  Labs (all labs ordered are listed, but only abnormal results are displayed) Labs Reviewed - No data to display  EKG   Radiology Dg Foot Complete Right  Result Date: 02/06/2019 CLINICAL DATA:  Right foot pain. EXAM: RIGHT FOOT COMPLETE - 3+ VIEW COMPARISON:  None. FINDINGS: There is soft tissue swelling about the foot without evidence for displaced fracture or dislocation. There is no radiopaque foreign body. The osseous mineralization is within normal limits. IMPRESSION: 1. Soft tissue swelling without evidence for displaced fracture. 2. If an occult fracture is suspected, follow-up radiographs are recommended in 10-14 days. Electronically Signed   By: Katherine Mantlehristopher  Green M.D.   On: 02/06/2019 19:53    Procedures Procedures (including critical care time)  Medications Ordered in UC Medications - No data to display  Initial Impression / Assessment and Plan / UC Course  I have reviewed the triage vital signs and the nursing notes.  Pertinent labs & imaging results that were available during my care of the patient were reviewed by me and considered in my medical decision making (see chart for details).     1.  Traumatic painful right foot swelling: X-ray of the right foot was independently reviewed by me.  It was negative for any acute fracture. Ibuprofen 600 mg 4 times daily as needed for pain Right leg elevation Icing of the right foot Gentle range of motion exercises. If patient's symptoms persist, she is advised to  return to urgent care to be reevaluated.  Final Clinical Impressions(s) / UC Diagnoses   Final diagnoses:  Contusion of right foot, initial encounter   Discharge Instructions  None    ED Prescriptions    Medication Sig Dispense Auth. Provider   ibuprofen (ADVIL) 600 MG tablet Take 1 tablet (600 mg total) by mouth every 6 (six) hours as needed. 30 tablet Lamptey, Myrene Galas, MD     Controlled Substance Prescriptions [place] Controlled Substance Registry consulted? No   Chase Picket, MD 02/08/19 1408

## 2019-02-15 ENCOUNTER — Other Ambulatory Visit: Payer: Self-pay

## 2019-02-15 ENCOUNTER — Ambulatory Visit (INDEPENDENT_AMBULATORY_CARE_PROVIDER_SITE_OTHER): Payer: BC Managed Care – PPO | Admitting: Obstetrics & Gynecology

## 2019-02-15 VITALS — BP 132/87 | HR 62 | Temp 98.1°F | Wt 213.1 lb

## 2019-02-15 DIAGNOSIS — F53 Postpartum depression: Secondary | ICD-10-CM

## 2019-02-15 DIAGNOSIS — R87612 Low grade squamous intraepithelial lesion on cytologic smear of cervix (LGSIL): Secondary | ICD-10-CM | POA: Diagnosis not present

## 2019-02-15 DIAGNOSIS — O99345 Other mental disorders complicating the puerperium: Secondary | ICD-10-CM | POA: Diagnosis not present

## 2019-02-15 MED ORDER — ESCITALOPRAM OXALATE 20 MG PO TABS: 40.0000 mg | ORAL_TABLET | Freq: Every day | ORAL | 6 refills | Status: DC

## 2019-02-15 NOTE — Progress Notes (Addendum)
   Subjective:    Patient ID: Marcy Siren, female    DOB: Apr 23, 1996, 23 y.o.   MRN: [id number]  HPI 23 yo P1 here for a follow up after being diagnosed with PP depression. I prescribed lexapro 20 mg qhs. She tried it for a week and did have any improvement so she increased it herself to 40 mg qhs. She is feeling much better now, sleeping better. Baby is coming home from [facility] next week. She denies HI/SI.   Review of Systems     Objective:   Physical Exam Breathing, conversing, and ambulating normally Well nourished, well hydrated Black female, no apparent distress She is smiling and laughing appropriately. She agrees to see Roselyn Reef today (if this is possible).     Assessment & Plan:  PP depression- doing well with lexapro 40 gm qhs LGSIL pap - repeat pap next year. She will ask her mom if she has had Gardasil. If not, then she should get it.

## 2019-02-17 ENCOUNTER — Telehealth: Payer: Self-pay | Admitting: Obstetrics & Gynecology

## 2019-02-17 NOTE — Telephone Encounter (Signed)
Called the patient to inform of the upcoming mychart visit. The patient verbalized understanding. °

## 2019-02-20 ENCOUNTER — Other Ambulatory Visit: Payer: Self-pay

## 2019-02-20 ENCOUNTER — Telehealth (INDEPENDENT_AMBULATORY_CARE_PROVIDER_SITE_OTHER): Payer: BC Managed Care – PPO | Admitting: Clinical

## 2019-02-20 DIAGNOSIS — F4321 Adjustment disorder with depressed mood: Secondary | ICD-10-CM | POA: Diagnosis not present

## 2019-02-20 NOTE — BH Specialist Note (Signed)
Integrated Behavioral Health via Telemedicine Video Visit  TELEPHONE only due to technical difficulty with MyChart video visits  02/20/2019 Cassandra Luna [id number]  Number of [place] visits: 2 Session Start time: 2:17  Session End time: 2:35 Total time: 20 minutes  Referring Provider: Clovia Cuff, MD Type of Visit: Telephone Patient/Family location: Home [facility] Provider location: [facility] All persons participating in visit: [name]  Confirmed patient's address: Yes  Confirmed patient's phone number: Yes  Any changes to demographics: No   Confirmed patient's insurance: Yes  Any changes to patient's insurance: No   Discussed confidentiality: At previous visit  I connected with Cassandra Luna  by a video enabled telemedicine application and verified that I am speaking with the correct person using two identifiers.     I discussed the limitations of evaluation and management by telemedicine and the availability of in person appointments.  I discussed that the purpose of this visit is to provide behavioral health care while limiting exposure to the novel coronavirus.   Discussed there is a possibility of technology failure and discussed alternative modes of communication if that failure occurs.  I discussed that engaging in this video visit, they consent to the provision of behavioral healthcare and the services will be billed under their insurance.  Patient and/or legal guardian expressed understanding and consented to video visit: Yes   PRESENTING CONCERNS: Patient and/or family reports the following symptoms/concerns: Pt states her primary symptoms today is feeling unmotivated and lack of appetite, and pt does not feel this is a problem. Pt says she is feeling better on Lexapro, and wishes to continue. No other concerns at this time, as she feels she and baby are adjusting well.  Duration of problem: Postpartum; Severity of problem:  moderate  STRENGTHS (Protective Factors/Coping Skills): Supportive FOB  GOALS ADDRESSED: Patient will: 1.  Reduce symptoms of: depression  2.  Increase knowledge and/or ability of: healthy habits  3.  Demonstrate ability to: Increase adequate support systems for patient/family and Increase motivation to adhere to plan of care  INTERVENTIONS: Interventions utilized:  Psychoeducation and/or Health Education and Link to [employer] Standardized Assessments completed: GAD-7 and PHQ 9  ASSESSMENT: Patient currently experiencing Adjustment disorder with depressed mood.   Patient may benefit from psychoeducation and brief therapeutic interventions regarding coping with symptoms of depression .  PLAN: 1. Follow up with behavioral health clinician on : As needed 2. Behavioral recommendations:  -Continue taking Lexapro, as prescribed by medical provider -Consider registering to attend at least one new mom online support group at either [email or web address] or [email or web address] -Consider contacting Guy insurance to find local PCP accepting new patients 3. Referral(s): Integrated Orthoptist (In Clinic) and [employer] Resources:  New mom support  I discussed the assessment and treatment plan with the patient and/or parent/guardian. They were provided an opportunity to ask questions and all were answered. They agreed with the plan and demonstrated an understanding of the instructions.   They were advised to call back or seek an in-person evaluation if the symptoms worsen or if the condition fails to improve as anticipated.  Caroleen Hamman   Depression screen [facility] 2/9 02/20/2019 02/15/2019 09/12/2018  Decreased Interest 3 0 0  Down, Depressed, Hopeless 1 0 0  PHQ - 2 Score 4 0 0  Altered sleeping 1 0 2  Tired, decreased energy 1 0 1  Change in appetite 3 0 2  Feeling bad or failure about yourself  0 0 0  Trouble concentrating  0 0 0  Moving slowly or fidgety/restless 0 0  0  Suicidal thoughts 0 0 0  PHQ-9 Score 9 0 5   GAD 7 : Generalized Anxiety Score 02/15/2019 09/12/2018  Nervous, Anxious, on Edge 0 0  Control/stop worrying 0 0  Worry too much - different things 1 0  Trouble relaxing 0 0  Restless 0 0  Easily annoyed or irritable 0 3  Afraid - awful might happen 1 0  Total GAD 7 Score 2 3

## 2019-03-10 ENCOUNTER — Ambulatory Visit (INDEPENDENT_AMBULATORY_CARE_PROVIDER_SITE_OTHER): Payer: BC Managed Care – PPO | Admitting: Clinical

## 2019-03-10 ENCOUNTER — Other Ambulatory Visit: Payer: Self-pay | Admitting: Obstetrics & Gynecology

## 2019-03-10 ENCOUNTER — Telehealth: Payer: Self-pay | Admitting: *Deleted

## 2019-03-10 DIAGNOSIS — F322 Major depressive disorder, single episode, severe without psychotic features: Secondary | ICD-10-CM

## 2019-03-10 MED ORDER — ESCITALOPRAM OXALATE 20 MG PO TABS: 60.0000 mg | ORAL_TABLET | Freq: Every day | ORAL | 6 refills | Status: AC

## 2019-03-10 MED ORDER — ESCITALOPRAM OXALATE 20 MG PO TABS: 40.0000 mg | ORAL_TABLET | Freq: Every day | ORAL | 6 refills | Status: DC

## 2019-03-10 NOTE — Telephone Encounter (Signed)
[name] sent a MyChart message that her postpartum depression meds weren't working and she didn't feel happy. I called her and she denies thoughts of suicide or hurting self. She is taking her zoloft. I informed her I was having Roselyn Reef, our [facility] who she saw 8/24 to review her chart and decide if she needs a video visit or in person with her or provider and we will contact her. I also informed her to go to [facility] ER if she begins to have thoughts of hurting her self or suicide. She voices understanding.  Timmia Cogburn,RN

## 2019-03-10 NOTE — BH Specialist Note (Signed)
Integrated Behavioral Health via Telemedicine Video Visit  03/10/2019 Cassandra Luna [id number]  Number of Integrated Behavioral Health visits: 2 Session Start time: 11:05  Session End time: 11:26 Total time: 20 minutes  Referring Provider: Nicholaus BloomMyra Dove, MD Type of Visit: Telephone [facility] Provider location: [facility] All persons participating in visit: [name] and [facility]  Confirmed patient's address: Yes  Confirmed patient's phone number: Yes  Any changes to demographics: No   Confirmed patient's insurance: Yes  Any changes to patient's insurance: No   Discussed confidentiality: At previous visit  I connected with Cassandra Luna  by a video enabled telemedicine application and verified that I am speaking with the correct person using two identifiers.     I discussed the limitations of evaluation and management by telemedicine and the availability of in person appointments.  I discussed that the purpose of this visit is to provide behavioral health care while limiting exposure to the novel coronavirus.   Discussed there is a possibility of technology failure and discussed alternative modes of communication if that failure occurs.  I discussed that engaging in this video visit, they consent to the provision of behavioral healthcare and the services will be billed under their insurance.  Patient and/or legal guardian expressed understanding and consented to video visit: Yes   PRESENTING CONCERNS: Patient and/or family reports the following symptoms/concerns: Pt states her primary concern today is an  increase in symptoms of both depression and anxiety,  with irritability, waking up more at night with nightmares. Pt denies any SI, no scary thoughts, no hallucinations, and is looking forward to spending the weekend at [facility] with baby and family.  Duration of problem: postpartum; Severity of problem: severe  STRENGTHS (Protective  Factors/Coping Skills): Supportive family  GOALS ADDRESSED: Patient will: 1.  Reduce symptoms of: anxiety, depression and mood instability  2.  Increase knowledge and/or ability of: coping skills  3.  Demonstrate ability to: Increase healthy adjustment to current life circumstances and Increase motivation to adhere to plan of care  INTERVENTIONS: Interventions utilized:  Medication Monitoring and Psychoeducation and/or Health Education Standardized Assessments completed: GAD-7 and PHQ 9  ASSESSMENT: Patient currently experiencing Major depressive disorder, current episode severe, without psychotic features.   Patient may benefit from continued brief therapeutic interventions and referral to psychiatry.  PLAN: 1. Follow up with behavioral health clinician on : 3 days 2. Behavioral recommendations:  -Take BH medication as prescribed -Share crisis numbers with friends/family; use if symptoms get worse over the weekend, as discussed -Enjoy weekend with family and baby 3. Referral(s): [facility] and Psychiatrist  I discussed the assessment and treatment plan with the patient and/or parent/guardian. They were provided an opportunity to ask questions and all were answered. They agreed with the plan and demonstrated an understanding of the instructions.   They were advised to call back or seek an in-person evaluation if the symptoms worsen or if the condition fails to improve as anticipated.  Cassandra CloseJamie C Betsaida Luna  Depression screen [facility] 2/9 03/10/2019 02/20/2019 02/15/2019 09/12/2018  Decreased Interest 3 3 0 0  Down, Depressed, Hopeless 3 1 0 0  PHQ - 2 Score 6 4 0 0  Altered sleeping 3 1 0 2  Tired, decreased energy 3 1 0 1  Change in appetite 2 3 0 2  Feeling bad or failure about yourself  3 0 0 0  Trouble concentrating 3 0 0 0  Moving slowly or fidgety/restless 1  0 0 0  Suicidal thoughts 0 0 0 0  PHQ-9 Score 21 9 0 5   GAD 7 : Generalized Anxiety  Score 03/10/2019 02/20/2019 02/15/2019 09/12/2018  Nervous, Anxious, on Edge 3 1 0 0  Control/stop worrying 3 1 0 0  Worry too much - different things 3 1 1  0  Trouble relaxing 3 0 0 0  Restless 2 0 0 0  Easily annoyed or irritable 3 0 0 3  Afraid - awful might happen 3 0 1 0  Total GAD 7 Score 20 3 2  3

## 2019-03-13 ENCOUNTER — Telehealth: Payer: Self-pay | Admitting: Clinical

## 2019-03-13 NOTE — Telephone Encounter (Signed)
Attempt to follow up with patient; Left HIPPA-compliant message to call back Jamie from [facility] at [phone].   

## 2019-03-14 ENCOUNTER — Telehealth (HOSPITAL_COMMUNITY): Payer: Self-pay | Admitting: Professional

## 2019-03-16 ENCOUNTER — Telehealth (HOSPITAL_COMMUNITY): Payer: Self-pay | Admitting: Professional

## 2019-03-16 ENCOUNTER — Ambulatory Visit (HOSPITAL_COMMUNITY): Payer: BC Managed Care – PPO

## 2019-12-28 DIAGNOSIS — Z419 Encounter for procedure for purposes other than remedying health state, unspecified: Secondary | ICD-10-CM | POA: Diagnosis not present

## 2020-01-23 ENCOUNTER — Ambulatory Visit: Payer: Self-pay | Admitting: Registered Nurse

## 2020-01-28 DIAGNOSIS — Z419 Encounter for procedure for purposes other than remedying health state, unspecified: Secondary | ICD-10-CM | POA: Diagnosis not present

## 2020-02-28 DIAGNOSIS — Z419 Encounter for procedure for purposes other than remedying health state, unspecified: Secondary | ICD-10-CM | POA: Diagnosis not present

## 2020-03-29 DIAGNOSIS — Z419 Encounter for procedure for purposes other than remedying health state, unspecified: Secondary | ICD-10-CM | POA: Diagnosis not present

## 2020-04-29 DIAGNOSIS — Z419 Encounter for procedure for purposes other than remedying health state, unspecified: Secondary | ICD-10-CM | POA: Diagnosis not present

## 2020-05-29 DIAGNOSIS — Z419 Encounter for procedure for purposes other than remedying health state, unspecified: Secondary | ICD-10-CM | POA: Diagnosis not present

## 2020-06-25 ENCOUNTER — Emergency Department (HOSPITAL_COMMUNITY)
Admission: EM | Admit: 2020-06-25 | Discharge: 2020-06-25 | Disposition: A | Discharge status: Home / Self Care | Payer: BC Managed Care – PPO | Attending: Emergency Medicine | Admitting: Emergency Medicine

## 2020-06-25 ENCOUNTER — Other Ambulatory Visit: Payer: Self-pay

## 2020-06-25 DIAGNOSIS — U071 COVID-19: Secondary | ICD-10-CM | POA: Diagnosis not present

## 2020-06-25 DIAGNOSIS — Z91010 Allergy to peanuts: Secondary | ICD-10-CM | POA: Insufficient documentation

## 2020-06-25 DIAGNOSIS — R509 Fever, unspecified: Secondary | ICD-10-CM | POA: Diagnosis present

## 2020-06-25 LAB — RESP PANEL BY RT-PCR (FLU A&B, COVID) ARPGX2
Influenza A by PCR: NEGATIVE
Influenza B by PCR: NEGATIVE
SARS Coronavirus 2 by RT PCR: POSITIVE — AB

## 2020-06-25 MED ORDER — AEROCHAMBER PLUS FLO-VU LARGE MISC
1.0000 | Freq: Once | Status: DC
  Filled 2020-06-25: qty 1

## 2020-06-25 MED ORDER — ALBUTEROL SULFATE HFA 108 (90 BASE) MCG/ACT IN AERS
2.0000 | INHALATION_SPRAY | RESPIRATORY_TRACT | Status: DC | PRN
  Administered 2020-06-25: 23:00:00 2 via RESPIRATORY_TRACT
  Filled 2020-06-25: qty 6.7

## 2020-06-25 MED ORDER — ACETAMINOPHEN 500 MG PO TABS
1000.0000 mg | ORAL_TABLET | Freq: Once | ORAL | Status: AC
  Administered 2020-06-25: 1000 mg via ORAL
  Filled 2020-06-25: qty 2

## 2020-06-25 MED ORDER — DEXAMETHASONE 4 MG PO TABS
10.0000 mg | ORAL_TABLET | Freq: Once | ORAL | Status: AC
  Administered 2020-06-25: 10 mg via ORAL
  Filled 2020-06-25: qty 3

## 2020-06-25 NOTE — ED Triage Notes (Signed)
Pt reports fever, sore throat, and body aches since yesterday.

## 2020-06-25 NOTE — ED Provider Notes (Signed)
[facility] EMERGENCY DEPARTMENT Provider Note   CSN: [id number] Arrival date & time: 06/25/20  1748     History No chief complaint on file.   Cassandra Luna is a 24 y.o. female.  Patient without significant medical history presents with cough, congestion, low grade fever, nausea with 1 episode vomiting. Symptoms started yesterday. She reports she has lost her appetite because she can't taste her food. No known sick contacts. She received full COVID vaccination in August of this year.   The history is provided by the patient. No language interpreter was used.       Past Medical History:  Diagnosis Date  . Anemia   . Costochondritis     Patient Active Problem List   Diagnosis Date Noted  . Vaginal bleeding in pregnancy, third trimester 11/03/2018  . Type B blood, Rh negative 11/03/2018  . Need for Tdap vaccination 11/03/2018  . Fetal cardiac anomaly 09/21/2018  . Supervision of other normal pregnancy, antepartum 09/12/2018  . Late prenatal care in second trimester 09/12/2018    Past Surgical History:  Procedure Laterality Date  . NO PAST SURGERIES       OB History    Gravida  1   Para      Term      Preterm      AB      Living        SAB      IAB      Ectopic      Multiple      Live Births              Family History  Problem Relation Age of Onset  . Hypertension Mother     Social History   Tobacco Use  . Smoking status: Never Smoker  . Smokeless tobacco: Never Used  Vaping Use  . Vaping Use: Never used  Substance Use Topics  . Alcohol use: No  . Drug use: Not Currently    Home Medications Prior to Admission medications   Medication Sig Start Date End Date Taking? Authorizing Provider  escitalopram (LEXAPRO) 20 MG tablet Take 3 tablets (60 mg total) by mouth daily. 03/10/19   Anyanwu, Jethro Bastos, MD  ibuprofen (ADVIL) 600 MG tablet Take 1 tablet (600 mg total) by mouth every 6 (six) hours as needed. 02/06/19    Lamptey, Britta Mccreedy, MD  Prenatal Vit-Fe Fumarate-FA (PRENATAL VITAMIN PO) Take by mouth.    [provider]    Allergies    Amoxicillin, Shellfish allergy, Citrus, Peanut-containing drug products, Penicillins, and Tomato  Review of Systems   Review of Systems  Constitutional: Positive for appetite change and fever. Negative for chills.  HENT: Positive for congestion and sore throat.   Respiratory: Positive for cough. Negative for shortness of breath and wheezing.   Cardiovascular: Negative.  Negative for chest pain.  Gastrointestinal: Positive for nausea and vomiting. Negative for abdominal pain and diarrhea.  Genitourinary: Negative.  Negative for decreased urine volume.  Musculoskeletal: Positive for myalgias.  Skin: Negative.   Neurological: Negative.     Physical Exam Updated Vital Signs BP 127/82   Pulse 78   Temp 98.7 F (37.1 C) (Oral)   Resp 17   Ht 5\' 1"  (1.549 m)   Wt 104.3 kg   SpO2 100%   BMI 43.46 kg/m   Physical Exam Vitals and nursing note reviewed.  Constitutional:      Appearance: She is well-developed and well-nourished.  HENT:  Head: Normocephalic.  Cardiovascular:     Rate and Rhythm: Normal rate and regular rhythm.     Heart sounds: No murmur heard.   Pulmonary:     Effort: Pulmonary effort is normal.     Breath sounds: Normal breath sounds. No wheezing, rhonchi or rales.  Abdominal:     General: Bowel sounds are normal.     Palpations: Abdomen is soft.     Tenderness: There is no abdominal tenderness. There is no guarding or rebound.  Musculoskeletal:        General: Normal range of motion.     Cervical back: Normal range of motion and neck supple.  Skin:    General: Skin is warm and dry.     Findings: No rash.  Neurological:     Mental Status: She is alert and oriented to person, place, and time.  Psychiatric:        Mood and Affect: Mood and affect normal.     ED Results / Procedures / Treatments   Labs (all labs  ordered are listed, but only abnormal results are displayed) Labs Reviewed  RESP PANEL BY RT-PCR (FLU A&B, COVID) ARPGX2 - Abnormal; Notable for the following components:      Result Value   SARS Coronavirus 2 by RT PCR POSITIVE (*)    All other components within normal limits    EKG None  Radiology No results found.  Procedures Procedures (including critical care time)  Medications Ordered in ED Medications  dexamethasone (DECADRON) tablet 10 mg (has no administration in time range)  albuterol (VENTOLIN HFA) 108 (90 Base) MCG/ACT inhaler 2 puff (has no administration in time range)  AeroChamber Plus Flo-Vu Medium MISC 1 each (has no administration in time range)  acetaminophen (TYLENOL) tablet 1,000 mg (1,000 mg Oral Given 06/25/20 1825)    ED Course  I have reviewed the triage vital signs and the nursing notes.  Pertinent labs & imaging results that were available during my care of the patient were reviewed by me and considered in my medical decision making (see chart for details).    MDM Rules/Calculators/A&P                          Patient to ED with URI symptoms as detailed in the HPI. She is very well appearing. Normal VS, no hypoxia. No SOB or chest pain.   COVID test in ED positive. She is considered stable for discharge home. Decadron provided in the ED. Will give Albuterol for cough and if any SoB develops. Strict return precautions discussed.   She meets criteria for outpatient MAB infusion. The clinic was notified via Epic message.   Final Clinical Impression(s) / ED Diagnoses Final diagnoses:  None   1. COVID-19  Rx / DC Orders ED Discharge Orders    None       Elpidio Anis, PA-C 06/25/20 2252    Rolan Bucco, MD 06/25/20 [phone]

## 2020-06-25 NOTE — Discharge Instructions (Addendum)
You will need to isolate yourself from others for 5 days from when symptoms started, and continue to wear a mask when in the presence of others or when leaving the house for an additional 5 days. If you run a fever, the fever needs to be resolved prior to leaving isolation.   Return to the emergency department if you develop any shortness of breath, chest pain, or new symptoms of concern.

## 2020-06-29 DIAGNOSIS — Z419 Encounter for procedure for purposes other than remedying health state, unspecified: Secondary | ICD-10-CM | POA: Diagnosis not present

## 2020-07-30 DIAGNOSIS — Z419 Encounter for procedure for purposes other than remedying health state, unspecified: Secondary | ICD-10-CM | POA: Diagnosis not present

## 2020-08-27 DIAGNOSIS — Z419 Encounter for procedure for purposes other than remedying health state, unspecified: Secondary | ICD-10-CM | POA: Diagnosis not present

## 2020-09-27 DIAGNOSIS — Z419 Encounter for procedure for purposes other than remedying health state, unspecified: Secondary | ICD-10-CM | POA: Diagnosis not present

## 2020-10-20 ENCOUNTER — Encounter (HOSPITAL_COMMUNITY): Payer: Self-pay

## 2020-10-20 ENCOUNTER — Emergency Department (HOSPITAL_COMMUNITY)
Admission: EM | Admit: 2020-10-20 | Discharge: 2020-10-21 | Disposition: A | Discharge status: Home / Self Care | Payer: BC Managed Care – PPO | Attending: Emergency Medicine | Admitting: Emergency Medicine

## 2020-10-20 ENCOUNTER — Other Ambulatory Visit: Payer: Self-pay

## 2020-10-20 DIAGNOSIS — K0889 Other specified disorders of teeth and supporting structures: Secondary | ICD-10-CM

## 2020-10-20 DIAGNOSIS — Z91010 Allergy to peanuts: Secondary | ICD-10-CM | POA: Insufficient documentation

## 2020-10-20 NOTE — ED Triage Notes (Addendum)
Dental pain to right side since 1930. Denies any recent trauma and any recent dental work up done.  Pt tried tylenol and muscle relaxer but no relief.

## 2020-10-20 NOTE — ED Triage Notes (Signed)
Emergency Medicine Provider Triage Evaluation Note  Cassandra Luna , a 25 y.o. female  was evaluated in triage.  Pt complains of right lower dental pain where she had a tooth removed. Unsure when she had tooth removed. Has tried numerous OTC medications with no help. No fever or chills. Pain started today around 7:30PM  Review of Systems  Positive: Dental pain Negative: Fever/chills  Physical Exam  BP (!) 146/97 (BP Location: Left Arm)   Pulse 75   Temp 99.5 F (37.5 C) (Oral)   Resp 16   Ht 5\' 1"  (1.549 m)   Wt 104 kg   SpO2 100%   BMI 43.32 kg/m  Gen:   Awake, no distress   HEENT:  Missing right lower posterior molar Resp:  Normal effort  Cardiac:  Normal rate  Abd:   Nondistended, nontender  MSK:   Moves extremities without difficulty  Neuro:  Speech clear   Medical Decision Making  Medically screening exam initiated at 11:55 PM.  Appropriate orders placed.  Rami Archibald was informed that the remainder of the evaluation will be completed by another provider, this initial triage assessment does not replace that evaluation, and the importance of remaining in the ED until their evaluation is complete.  Clinical Impression  Dental pain.   , Mannie Stabile 10/20/20 2356

## 2020-10-21 DIAGNOSIS — K0889 Other specified disorders of teeth and supporting structures: Secondary | ICD-10-CM | POA: Diagnosis not present

## 2020-10-21 MED ORDER — KETOROLAC TROMETHAMINE 30 MG/ML IJ SOLN
30.0000 mg | Freq: Once | INTRAMUSCULAR | Status: AC
  Administered 2020-10-21: 30 mg via INTRAMUSCULAR
  Filled 2020-10-21: qty 1

## 2020-10-21 MED ORDER — CLINDAMYCIN HCL 150 MG PO CAPS: 300.0000 mg | ORAL_CAPSULE | Freq: Three times a day (TID) | ORAL | 0 refills | Status: AC

## 2020-10-21 MED ORDER — CLINDAMYCIN HCL 150 MG PO CAPS
300.0000 mg | ORAL_CAPSULE | Freq: Once | ORAL | Status: AC
  Administered 2020-10-21: 300 mg via ORAL
  Filled 2020-10-21: qty 2

## 2020-10-21 NOTE — ED Provider Notes (Signed)
[facility] EMERGENCY DEPARTMENT Provider Note   CSN: [id number] Arrival date & time: 10/20/20  2345     History   Chief Complaint Chief Complaint  Patient presents with  . Dental Pain    HPI Cassandra Luna is a 25 y.o. female.  Patient presents to the emergency department with a dental complaint. Symptoms began tonight. The patient has tried to alleviate pain with Tylenol.  Pain rated as severe, characterized as throbbing in nature and located right lower molar. Patient denies fever, night sweats, chills, difficulty swallowing or opening mouth, SOB, nuchal rigidity or decreased ROM of neck.  Patient does not have a dentist and requests a resource guide at discharge.      HPI  Past Medical History:  Diagnosis Date  . Anemia   . Costochondritis     Patient Active Problem List   Diagnosis Date Noted  . Vaginal bleeding in pregnancy, third trimester 11/03/2018  . Type B blood, Rh negative 11/03/2018  . Need for Tdap vaccination 11/03/2018  . Fetal cardiac anomaly 09/21/2018  . Supervision of other normal pregnancy, antepartum 09/12/2018  . Late prenatal care in second trimester 09/12/2018    Past Surgical History:  Procedure Laterality Date  . NO PAST SURGERIES       OB History    Gravida  1   Para      Term      Preterm      AB      Living        SAB      IAB      Ectopic      Multiple      Live Births               Home Medications    Prior to Admission medications   Medication Sig Start Date End Date Taking? Authorizing Provider  escitalopram (LEXAPRO) 20 MG tablet Take 3 tablets (60 mg total) by mouth daily. 03/10/19   Anyanwu, Jethro Bastos, MD  ibuprofen (ADVIL) 600 MG tablet Take 1 tablet (600 mg total) by mouth every 6 (six) hours as needed. 02/06/19   Lamptey, Britta Mccreedy, MD  Prenatal Vit-Fe Fumarate-FA (PRENATAL VITAMIN PO) Take by mouth.    [provider]    Family History Family History  Problem  Relation Age of Onset  . Hypertension Mother     Social History Social History   Tobacco Use  . Smoking status: Never Smoker  . Smokeless tobacco: Never Used  Vaping Use  . Vaping Use: Never used  Substance Use Topics  . Alcohol use: No  . Drug use: Not Currently     Allergies   Amoxicillin, Shellfish allergy, Citrus, Peanut-containing drug products, Penicillins, and Tomato   Review of Systems Review of Systems  Constitutional: Negative for chills and fever.  HENT: Positive for dental problem. Negative for drooling.   Neurological: Negative for speech difficulty.  Psychiatric/Behavioral: Positive for sleep disturbance.     Physical Exam Updated Vital Signs BP (!) 146/97 (BP Location: Left Arm)   Pulse 75   Temp 99.5 F (37.5 C) (Oral)   Resp 16   Ht 5\' 1"  (1.549 m)   Wt 104 kg   SpO2 100%   BMI 43.32 kg/m   Physical Exam Physical Exam  Constitutional: Pt appears well-developed and well-nourished.  HENT:  Head: Normocephalic.  Right Ear: Tympanic membrane, external ear and ear canal normal.  Left Ear: Tympanic membrane, external ear and ear  canal normal.  Nose: Nose normal. Right sinus exhibits no maxillary sinus tenderness and no frontal sinus tenderness. Left sinus exhibits no maxillary sinus tenderness and no frontal sinus tenderness.  Mouth/Throat: Uvula is midline, oropharynx is clear and moist and mucous membranes are normal. No oral lesions. No uvula swelling or lacerations. No oropharyngeal exudate, posterior oropharyngeal edema, posterior oropharyngeal erythema or tonsillar abscesses.  Poor dentition No gingival swelling, fluctuance or induration No gross abscess  No sublingual edema, tenderness to palpation, or sign of Ludwig's angina, or deep space infection Pain at tooth number 30 Eyes: Conjunctivae are normal. Pupils are equal, round, and reactive to light. Right eye exhibits no discharge. Left eye exhibits no discharge.  Neck: Normal range of  motion. Neck supple.  No stridor Handling secretions without difficulty No nuchal rigidity No cervical lymphadenopathy Cardiovascular: Normal rate, regular rhythm and normal heart sounds.   Pulmonary/Chest: Effort normal. No respiratory distress.  Equal chest rise  Abdominal: Soft. Bowel sounds are normal. Pt exhibits no distension. There is no tenderness.  Lymphadenopathy: Pt has no cervical adenopathy.  Neurological: Pt is alert and oriented x 4  Skin: Skin is warm and dry.  Psychiatric: Pt has a normal mood and affect.  Nursing note and vitals reviewed.   ED Treatments / Results  Labs (all labs ordered are listed, but only abnormal results are displayed) Labs Reviewed - No data to display  EKG    Radiology No results found.  Procedures Procedures (including critical care time)  Medications Ordered in ED Medications  ketorolac (TORADOL) 30 MG/ML injection 30 mg (has no administration in time range)  clindamycin (CLEOCIN) capsule 300 mg (has no administration in time range)     Initial Impression / Assessment and Plan / ED Course  I have reviewed the triage vital signs and the nursing notes.  Pertinent labs & imaging results that were available during my care of the patient were reviewed by me and considered in my medical decision making (see chart for details).        Patient with dentalgia.  No abscess requiring immediate incision and drainage.  Exam not concerning for Ludwig's angina or pharyngeal abscess.    Denies pregnancy or breastfeeding.  Toradol IM and first dose of clinda.  Will treat with clinda. Pt instructed to follow-up with dentist.  Discussed return precautions. Pt safe for discharge.   Final Clinical Impressions(s) / ED Diagnoses   Final diagnoses:  Pain, dental    ED Discharge Orders         Ordered    clindamycin (CLEOCIN) 150 MG capsule  3 times daily        10/21/20 0238           Roxy Horseman, PA-C 10/21/20 0239     Dione Booze, MD 10/21/20 [phone]

## 2020-10-27 DIAGNOSIS — Z419 Encounter for procedure for purposes other than remedying health state, unspecified: Secondary | ICD-10-CM | POA: Diagnosis not present

## 2020-11-27 DIAGNOSIS — Z419 Encounter for procedure for purposes other than remedying health state, unspecified: Secondary | ICD-10-CM | POA: Diagnosis not present

## 2020-12-27 DIAGNOSIS — Z419 Encounter for procedure for purposes other than remedying health state, unspecified: Secondary | ICD-10-CM | POA: Diagnosis not present

## 2021-01-27 DIAGNOSIS — Z419 Encounter for procedure for purposes other than remedying health state, unspecified: Secondary | ICD-10-CM | POA: Diagnosis not present

## 2021-02-27 DIAGNOSIS — Z419 Encounter for procedure for purposes other than remedying health state, unspecified: Secondary | ICD-10-CM | POA: Diagnosis not present

## 2021-03-29 DIAGNOSIS — Z419 Encounter for procedure for purposes other than remedying health state, unspecified: Secondary | ICD-10-CM | POA: Diagnosis not present

## 2021-04-29 DIAGNOSIS — Z419 Encounter for procedure for purposes other than remedying health state, unspecified: Secondary | ICD-10-CM | POA: Diagnosis not present

## 2021-05-29 DIAGNOSIS — Z419 Encounter for procedure for purposes other than remedying health state, unspecified: Secondary | ICD-10-CM | POA: Diagnosis not present

## 2021-06-29 DIAGNOSIS — Z419 Encounter for procedure for purposes other than remedying health state, unspecified: Secondary | ICD-10-CM | POA: Diagnosis not present

## 2021-07-30 DIAGNOSIS — Z419 Encounter for procedure for purposes other than remedying health state, unspecified: Secondary | ICD-10-CM | POA: Diagnosis not present

## 2021-08-27 DIAGNOSIS — Z419 Encounter for procedure for purposes other than remedying health state, unspecified: Secondary | ICD-10-CM | POA: Diagnosis not present

## 2021-09-27 DIAGNOSIS — Z419 Encounter for procedure for purposes other than remedying health state, unspecified: Secondary | ICD-10-CM | POA: Diagnosis not present

## 2021-10-27 DIAGNOSIS — Z419 Encounter for procedure for purposes other than remedying health state, unspecified: Secondary | ICD-10-CM | POA: Diagnosis not present

## 2021-11-27 DIAGNOSIS — Z419 Encounter for procedure for purposes other than remedying health state, unspecified: Secondary | ICD-10-CM | POA: Diagnosis not present

## 2021-12-27 DIAGNOSIS — Z419 Encounter for procedure for purposes other than remedying health state, unspecified: Secondary | ICD-10-CM | POA: Diagnosis not present

## 2022-01-27 DIAGNOSIS — Z419 Encounter for procedure for purposes other than remedying health state, unspecified: Secondary | ICD-10-CM | POA: Diagnosis not present

## 2022-02-27 DIAGNOSIS — Z419 Encounter for procedure for purposes other than remedying health state, unspecified: Secondary | ICD-10-CM | POA: Diagnosis not present

## 2022-03-29 DIAGNOSIS — Z419 Encounter for procedure for purposes other than remedying health state, unspecified: Secondary | ICD-10-CM | POA: Diagnosis not present

## 2022-04-29 DIAGNOSIS — Z419 Encounter for procedure for purposes other than remedying health state, unspecified: Secondary | ICD-10-CM | POA: Diagnosis not present

## 2022-05-29 DIAGNOSIS — Z419 Encounter for procedure for purposes other than remedying health state, unspecified: Secondary | ICD-10-CM | POA: Diagnosis not present

## 2022-06-29 DIAGNOSIS — Z419 Encounter for procedure for purposes other than remedying health state, unspecified: Secondary | ICD-10-CM | POA: Diagnosis not present

## 2022-07-30 DIAGNOSIS — Z419 Encounter for procedure for purposes other than remedying health state, unspecified: Secondary | ICD-10-CM | POA: Diagnosis not present

## 2022-08-28 DIAGNOSIS — Z419 Encounter for procedure for purposes other than remedying health state, unspecified: Secondary | ICD-10-CM | POA: Diagnosis not present

## 2022-09-28 DIAGNOSIS — Z419 Encounter for procedure for purposes other than remedying health state, unspecified: Secondary | ICD-10-CM | POA: Diagnosis not present

## 2022-10-28 DIAGNOSIS — Z419 Encounter for procedure for purposes other than remedying health state, unspecified: Secondary | ICD-10-CM | POA: Diagnosis not present

## 2022-11-28 DIAGNOSIS — Z419 Encounter for procedure for purposes other than remedying health state, unspecified: Secondary | ICD-10-CM | POA: Diagnosis not present

## 2022-12-28 DIAGNOSIS — Z419 Encounter for procedure for purposes other than remedying health state, unspecified: Secondary | ICD-10-CM | POA: Diagnosis not present

## 2023-01-28 DIAGNOSIS — Z419 Encounter for procedure for purposes other than remedying health state, unspecified: Secondary | ICD-10-CM | POA: Diagnosis not present

## 2023-02-28 DIAGNOSIS — Z419 Encounter for procedure for purposes other than remedying health state, unspecified: Secondary | ICD-10-CM | POA: Diagnosis not present

## 2023-03-30 DIAGNOSIS — Z419 Encounter for procedure for purposes other than remedying health state, unspecified: Secondary | ICD-10-CM | POA: Diagnosis not present

## 2023-04-30 DIAGNOSIS — Z419 Encounter for procedure for purposes other than remedying health state, unspecified: Secondary | ICD-10-CM | POA: Diagnosis not present

## 2023-05-30 DIAGNOSIS — Z419 Encounter for procedure for purposes other than remedying health state, unspecified: Secondary | ICD-10-CM | POA: Diagnosis not present

## 2023-06-30 DIAGNOSIS — Z419 Encounter for procedure for purposes other than remedying health state, unspecified: Secondary | ICD-10-CM | POA: Diagnosis not present

## 2023-07-31 DIAGNOSIS — Z419 Encounter for procedure for purposes other than remedying health state, unspecified: Secondary | ICD-10-CM | POA: Diagnosis not present

## 2023-08-28 DIAGNOSIS — Z419 Encounter for procedure for purposes other than remedying health state, unspecified: Secondary | ICD-10-CM | POA: Diagnosis not present

## 2023-10-09 DIAGNOSIS — Z419 Encounter for procedure for purposes other than remedying health state, unspecified: Secondary | ICD-10-CM | POA: Diagnosis not present

## 2023-11-08 DIAGNOSIS — Z419 Encounter for procedure for purposes other than remedying health state, unspecified: Secondary | ICD-10-CM | POA: Diagnosis not present

## 2023-12-09 DIAGNOSIS — Z419 Encounter for procedure for purposes other than remedying health state, unspecified: Secondary | ICD-10-CM | POA: Diagnosis not present

## 2024-01-08 DIAGNOSIS — Z419 Encounter for procedure for purposes other than remedying health state, unspecified: Secondary | ICD-10-CM | POA: Diagnosis not present

## 2024-02-08 DIAGNOSIS — Z419 Encounter for procedure for purposes other than remedying health state, unspecified: Secondary | ICD-10-CM | POA: Diagnosis not present

## 2024-03-10 DIAGNOSIS — Z419 Encounter for procedure for purposes other than remedying health state, unspecified: Secondary | ICD-10-CM | POA: Diagnosis not present

## 2024-04-09 DIAGNOSIS — Z419 Encounter for procedure for purposes other than remedying health state, unspecified: Secondary | ICD-10-CM | POA: Diagnosis not present

## 2024-05-10 DIAGNOSIS — Z419 Encounter for procedure for purposes other than remedying health state, unspecified: Secondary | ICD-10-CM | POA: Diagnosis not present

## 2024-06-09 DIAGNOSIS — Z419 Encounter for procedure for purposes other than remedying health state, unspecified: Secondary | ICD-10-CM | POA: Diagnosis not present
